# Patient Record
Sex: Female | Born: 1935 | Race: White | Hispanic: No | Marital: Married | Smoking: Former smoker
Health system: Southern US, Community
[De-identification: ages and names within clinical notes are randomized; demographics above are authoritative.]

## PROBLEM LIST (undated history)

## (undated) DIAGNOSIS — E039 Hypothyroidism, unspecified: Secondary | ICD-10-CM

## (undated) DIAGNOSIS — M1990 Unspecified osteoarthritis, unspecified site: Secondary | ICD-10-CM

## (undated) DIAGNOSIS — G8929 Other chronic pain: Secondary | ICD-10-CM

## (undated) DIAGNOSIS — F419 Anxiety disorder, unspecified: Secondary | ICD-10-CM

## (undated) DIAGNOSIS — R51 Headache: Secondary | ICD-10-CM

## (undated) DIAGNOSIS — I10 Essential (primary) hypertension: Secondary | ICD-10-CM

## (undated) DIAGNOSIS — M797 Fibromyalgia: Secondary | ICD-10-CM

## (undated) DIAGNOSIS — I00 Rheumatic fever without heart involvement: Secondary | ICD-10-CM

## (undated) DIAGNOSIS — K219 Gastro-esophageal reflux disease without esophagitis: Secondary | ICD-10-CM

## (undated) DIAGNOSIS — E785 Hyperlipidemia, unspecified: Secondary | ICD-10-CM

## (undated) DIAGNOSIS — I499 Cardiac arrhythmia, unspecified: Secondary | ICD-10-CM

## (undated) DIAGNOSIS — I4891 Unspecified atrial fibrillation: Secondary | ICD-10-CM

## (undated) HISTORY — PX: CHOLECYSTECTOMY: SHX55

## (undated) HISTORY — PX: APPENDECTOMY: SHX54

---

## 1998-09-25 ENCOUNTER — Encounter: Admission: RE | Admit: 1998-09-25 | Discharge: 1998-10-09 | Payer: Self-pay | Admitting: Orthopedic Surgery

## 2009-01-10 ENCOUNTER — Ambulatory Visit: Payer: Self-pay | Admitting: Cardiology

## 2009-03-29 HISTORY — PX: CARPAL TUNNEL RELEASE: SHX101

## 2009-10-21 ENCOUNTER — Ambulatory Visit (HOSPITAL_BASED_OUTPATIENT_CLINIC_OR_DEPARTMENT_OTHER): Admission: RE | Admit: 2009-10-21 | Discharge: 2009-10-21 | Payer: Self-pay | Admitting: Orthopedic Surgery

## 2009-12-09 ENCOUNTER — Ambulatory Visit (HOSPITAL_BASED_OUTPATIENT_CLINIC_OR_DEPARTMENT_OTHER): Admission: RE | Admit: 2009-12-09 | Discharge: 2009-12-09 | Payer: Self-pay | Admitting: Orthopedic Surgery

## 2010-05-18 NOTE — Op Note (Signed)
  NAMEGENESEE, Samantha               ACCOUNT NO.:  [id number]  MEDICAL RECORD NO.:  [id number]          PATIENT TYPE:  AMB  LOCATION:  DSC                          FACILITY:  [facility]  PHYSICIAN:  Cindee Salt, M.D.       DATE OF BIRTH:  1933/10/07  DATE OF PROCEDURE:  12/09/2009 DATE OF DISCHARGE:                              OPERATIVE REPORT  PREOPERATIVE DIAGNOSIS:  Carpal tunnel syndrome, left hand.  POSTOPERATIVE DIAGNOSIS:  Carpal tunnel syndrome, left hand.  OPERATION:  Decompression of left median nerve.  SURGEON:  Cindee Salt, MD  ASSISTANT:  None.  ANESTHESIA:  Forearm based IV regional.  ANESTHESIOLOGIST:  Janetta Hora. Gelene Mink, MD.  HISTORY:  The patient is a 77 year old female with a history of carpal tunnel syndrome.  EMG nerve conduction is positive, which has not responded to conservative treatment.  She has elected to undergo surgical decompression.  Pre, peri, and postoperative course have been discussed along with risks and complications.  She is aware that there is no guarantee with the surgery, possibility of infection, recurrence of injury to arteries, nerves, tendons, incomplete relief of symptoms, and dystrophy.  In the preoperative area, the patient is seen, the extremity marked by both the patient and surgeon, and antibiotic given.  PROCEDURE:  The patient was brought to the operating room where a forearm based IV regional anesthetic was carried out without difficulty under the direction of Dr. Gelene Mink.  She was prepped using DuraPrep, supine position, left arm free.  A 3-minute dry time was allowed, time- out was taken confirming patient and procedure.  A longitudinal incision was made in the palm and carried down through the subcutaneous tissue. Bleeders were electrocauterized.  Palmar fascia was split.  Superficial palmar arch was identified.  The flexor tendon of the ring and little finger identified to the ulnar side of the median nerve.  The  carpal retinaculum was incised with sharp dissection.  Right angle and Sewall retractor were placed between the skin and forearm fascia.  The fascia was released for approximately 1.5 cm proximal to the wrist crease under direct vision.  The canal was explored.  The area of compression to the nerve was apparent.  No further lesions were identified.  The wound was irrigated.  The skin was closed with interrupted 5-0 Vicryl Rapide sutures.  Local infiltration with 0.25% Marcaine without epinephrine was given, approximately 5 mL was used.  A sterile compressive dressing and splint to the wrist with the fingers free was applied.  On deflation of the tourniquet, all fingers were immediately pinked.  She was taken to the recovery room for observation in satisfactory condition.  She will be discharged to home to return to the [facility] [place] in 1 week, on Vicodin.          ______________________________ Cindee Salt, M.D.    GK/MEDQ  D:  12/09/2009  T:  12/10/2009  Job:  [id number]  Electronically Signed by Cindee Salt M.D. on 05/18/2010 12:07:16 PM

## 2010-06-11 LAB — POCT I-STAT, CHEM 8
BUN: 21 mg/dL (ref 6–23)
Creatinine, Ser: 1.2 mg/dL (ref 0.4–1.2)
Glucose, Bld: 159 mg/dL — ABNORMAL HIGH (ref 70–99)
Hemoglobin: 13.6 g/dL (ref 12.0–15.0)
TCO2: 26 mmol/L (ref 0–100)

## 2010-06-11 LAB — PROTIME-INR: Prothrombin Time: 19.8 seconds — ABNORMAL HIGH (ref 11.6–15.2)

## 2010-06-13 LAB — POCT I-STAT, CHEM 8
Chloride: 105 mEq/L (ref 96–112)
Creatinine, Ser: 1.4 mg/dL — ABNORMAL HIGH (ref 0.4–1.2)
Glucose, Bld: 164 mg/dL — ABNORMAL HIGH (ref 70–99)
HCT: 39 % (ref 36.0–46.0)
Hemoglobin: 13.3 g/dL (ref 12.0–15.0)
Potassium: 4.2 mEq/L (ref 3.5–5.1)
Sodium: 140 mEq/L (ref 135–145)

## 2010-06-13 LAB — GLUCOSE, CAPILLARY

## 2011-11-25 ENCOUNTER — Encounter (HOSPITAL_BASED_OUTPATIENT_CLINIC_OR_DEPARTMENT_OTHER): Payer: Self-pay | Admitting: *Deleted

## 2011-11-25 NOTE — Progress Notes (Signed)
Pt here for rt/lt ctr-2011-coumadin was stopped-was not told to stop it. Called and talked to dr Merlyn Lot nurse to ck Will see if dr Christell Constant can do labs and ekg-pt-ptt Will call [facility] for recent cardiology notes Will need istat

## 2011-11-26 NOTE — Progress Notes (Signed)
Called dr Buel Ream office-they will do PT PTT early Tuesday am and fax results-pt has call into her cardiologist in [facility] and dr Merlyn Lot about stopping coumadin. Will need [employer]

## 2011-12-01 ENCOUNTER — Encounter (HOSPITAL_BASED_OUTPATIENT_CLINIC_OR_DEPARTMENT_OTHER)
Admission: RE | Disposition: A | Discharge status: Home / Self Care | Payer: Self-pay | Source: Ambulatory Visit | Attending: Orthopedic Surgery

## 2011-12-01 ENCOUNTER — Encounter (HOSPITAL_BASED_OUTPATIENT_CLINIC_OR_DEPARTMENT_OTHER): Payer: Self-pay | Admitting: Anesthesiology

## 2011-12-01 ENCOUNTER — Encounter (HOSPITAL_BASED_OUTPATIENT_CLINIC_OR_DEPARTMENT_OTHER): Payer: Self-pay | Admitting: *Deleted

## 2011-12-01 ENCOUNTER — Encounter (HOSPITAL_BASED_OUTPATIENT_CLINIC_OR_DEPARTMENT_OTHER): Payer: Self-pay | Admitting: Orthopedic Surgery

## 2011-12-01 ENCOUNTER — Ambulatory Visit (HOSPITAL_BASED_OUTPATIENT_CLINIC_OR_DEPARTMENT_OTHER)
Admission: RE | Admit: 2011-12-01 | Discharge: 2011-12-01 | Disposition: A | Discharge status: Home / Self Care | Payer: Medicare Other | Source: Ambulatory Visit | Attending: Orthopedic Surgery | Admitting: Orthopedic Surgery

## 2011-12-01 ENCOUNTER — Ambulatory Visit (HOSPITAL_BASED_OUTPATIENT_CLINIC_OR_DEPARTMENT_OTHER): Payer: Medicare Other | Admitting: Anesthesiology

## 2011-12-01 DIAGNOSIS — F411 Generalized anxiety disorder: Secondary | ICD-10-CM | POA: Insufficient documentation

## 2011-12-01 DIAGNOSIS — E119 Type 2 diabetes mellitus without complications: Secondary | ICD-10-CM | POA: Insufficient documentation

## 2011-12-01 DIAGNOSIS — Z7901 Long term (current) use of anticoagulants: Secondary | ICD-10-CM | POA: Insufficient documentation

## 2011-12-01 DIAGNOSIS — E039 Hypothyroidism, unspecified: Secondary | ICD-10-CM | POA: Insufficient documentation

## 2011-12-01 DIAGNOSIS — IMO0001 Reserved for inherently not codable concepts without codable children: Secondary | ICD-10-CM | POA: Insufficient documentation

## 2011-12-01 DIAGNOSIS — I10 Essential (primary) hypertension: Secondary | ICD-10-CM | POA: Insufficient documentation

## 2011-12-01 DIAGNOSIS — G5620 Lesion of ulnar nerve, unspecified upper limb: Secondary | ICD-10-CM | POA: Insufficient documentation

## 2011-12-01 DIAGNOSIS — Z880 Allergy status to penicillin: Secondary | ICD-10-CM | POA: Insufficient documentation

## 2011-12-01 DIAGNOSIS — I4891 Unspecified atrial fibrillation: Secondary | ICD-10-CM | POA: Insufficient documentation

## 2011-12-01 DIAGNOSIS — M129 Arthropathy, unspecified: Secondary | ICD-10-CM | POA: Insufficient documentation

## 2011-12-01 DIAGNOSIS — E785 Hyperlipidemia, unspecified: Secondary | ICD-10-CM | POA: Insufficient documentation

## 2011-12-01 HISTORY — DX: Headache: R51

## 2011-12-01 HISTORY — DX: Fibromyalgia: M79.7

## 2011-12-01 HISTORY — DX: Rheumatic fever without heart involvement: I00

## 2011-12-01 HISTORY — PX: ULNAR NERVE TRANSPOSITION: SHX2595

## 2011-12-01 HISTORY — DX: Essential (primary) hypertension: I10

## 2011-12-01 HISTORY — DX: Cardiac arrhythmia, unspecified: I49.9

## 2011-12-01 HISTORY — DX: Gastro-esophageal reflux disease without esophagitis: K21.9

## 2011-12-01 HISTORY — DX: Other chronic pain: G89.29

## 2011-12-01 HISTORY — DX: Hyperlipidemia, unspecified: E78.5

## 2011-12-01 HISTORY — DX: Unspecified osteoarthritis, unspecified site: M19.90

## 2011-12-01 HISTORY — DX: Hypothyroidism, unspecified: E03.9

## 2011-12-01 HISTORY — DX: Anxiety disorder, unspecified: F41.9

## 2011-12-01 LAB — POCT I-STAT, CHEM 8
Calcium, Ion: 1.22 mmol/L (ref 1.13–1.30)
Chloride: 108 mEq/L (ref 96–112)
Glucose, Bld: 118 mg/dL — ABNORMAL HIGH (ref 70–99)
HCT: 42 % (ref 36.0–46.0)
TCO2: 23 mmol/L (ref 0–100)

## 2011-12-01 SURGERY — ULNAR NERVE DECOMPRESSION/TRANSPOSITION
Anesthesia: General | Site: Elbow | Laterality: Left | Wound class: Clean

## 2011-12-01 MED ORDER — LACTATED RINGERS IV SOLN
INTRAVENOUS | Status: DC
  Administered 2011-12-01: 11:00:00 via INTRAVENOUS

## 2011-12-01 MED ORDER — ONDANSETRON HCL 4 MG/2ML IJ SOLN
INTRAMUSCULAR | Status: DC | PRN
  Administered 2011-12-01: 4 mg via INTRAVENOUS

## 2011-12-01 MED ORDER — OXYCODONE HCL 5 MG/5ML PO SOLN: 5.0000 mg | Freq: Once | ORAL | Status: DC | PRN

## 2011-12-01 MED ORDER — METOCLOPRAMIDE HCL 5 MG/ML IJ SOLN
INTRAMUSCULAR | Status: DC | PRN
  Administered 2011-12-01: 10 mg via INTRAVENOUS

## 2011-12-01 MED ORDER — HYDROCODONE-ACETAMINOPHEN 5-500 MG PO TABS: 1.0000 | ORAL_TABLET | ORAL | Status: AC | PRN

## 2011-12-01 MED ORDER — DEXAMETHASONE SODIUM PHOSPHATE 4 MG/ML IJ SOLN
INTRAMUSCULAR | Status: DC | PRN
  Administered 2011-12-01: 10 mg via INTRAVENOUS

## 2011-12-01 MED ORDER — OXYCODONE HCL 5 MG PO TABS: 5.0000 mg | ORAL_TABLET | Freq: Once | ORAL | Status: DC | PRN

## 2011-12-01 MED ORDER — PROPOFOL 10 MG/ML IV BOLUS
INTRAVENOUS | Status: DC | PRN
  Administered 2011-12-01: 200 mg via INTRAVENOUS

## 2011-12-01 MED ORDER — LIDOCAINE HCL (CARDIAC) 20 MG/ML IV SOLN
INTRAVENOUS | Status: DC | PRN
  Administered 2011-12-01: 40 mg via INTRAVENOUS

## 2011-12-01 MED ORDER — FENTANYL CITRATE 0.05 MG/ML IJ SOLN: 25.0000 ug | INTRAMUSCULAR | Status: DC | PRN

## 2011-12-01 MED ORDER — BUPIVACAINE HCL (PF) 0.25 % IJ SOLN
INTRAMUSCULAR | Status: DC | PRN
  Administered 2011-12-01: 8 mL

## 2011-12-01 MED ORDER — FENTANYL CITRATE 0.05 MG/ML IJ SOLN
INTRAMUSCULAR | Status: DC | PRN
  Administered 2011-12-01: 50 ug via INTRAVENOUS

## 2011-12-01 MED ORDER — METOCLOPRAMIDE HCL 5 MG/ML IJ SOLN: 10.0000 mg | Freq: Once | INTRAMUSCULAR | Status: DC | PRN

## 2011-12-01 MED ORDER — CHLORHEXIDINE GLUCONATE 4 % EX LIQD: 60.0000 mL | Freq: Once | CUTANEOUS | Status: DC

## 2011-12-01 SURGICAL SUPPLY — 50 items
BANDAGE GAUZE ELAST BULKY 4 IN (GAUZE/BANDAGES/DRESSINGS) ×2 IMPLANT
BLADE MINI RND TIP GREEN BEAV (BLADE) IMPLANT
BLADE SURG 15 STRL LF DISP TIS (BLADE) ×1 IMPLANT
BLADE SURG 15 STRL SS (BLADE) ×1
BNDG COHESIVE 3X5 TAN STRL LF (GAUZE/BANDAGES/DRESSINGS) ×4 IMPLANT
BNDG ESMARK 4X9 LF (GAUZE/BANDAGES/DRESSINGS) ×2 IMPLANT
CHLORAPREP W/TINT 26ML (MISCELLANEOUS) ×2 IMPLANT
CLOTH BEACON ORANGE TIMEOUT ST (SAFETY) ×2 IMPLANT
CORDS BIPOLAR (ELECTRODE) ×2 IMPLANT
COVER MAYO STAND STRL (DRAPES) ×2 IMPLANT
COVER TABLE BACK 60X90 (DRAPES) ×2 IMPLANT
CUFF TOURNIQUET SINGLE 18IN (TOURNIQUET CUFF) ×2 IMPLANT
DECANTER SPIKE VIAL GLASS SM (MISCELLANEOUS) IMPLANT
DRAPE EXTREMITY T 121X128X90 (DRAPE) ×2 IMPLANT
DRAPE SURG 17X23 STRL (DRAPES) ×2 IMPLANT
DRSG KUZMA FLUFF (GAUZE/BANDAGES/DRESSINGS) IMPLANT
DRSG PAD ABDOMINAL 8X10 ST (GAUZE/BANDAGES/DRESSINGS) ×2 IMPLANT
GAUZE SPONGE 4X4 16PLY XRAY LF (GAUZE/BANDAGES/DRESSINGS) IMPLANT
GAUZE XEROFORM 1X8 LF (GAUZE/BANDAGES/DRESSINGS) ×2 IMPLANT
GLOVE BIO SURGEON STRL SZ 6.5 (GLOVE) ×2 IMPLANT
GLOVE ECLIPSE 6.5 STRL STRAW (GLOVE) ×2 IMPLANT
GLOVE SURG ORTHO 8.0 STRL STRW (GLOVE) ×2 IMPLANT
GOWN BRE IMP PREV XXLGXLNG (GOWN DISPOSABLE) ×2 IMPLANT
GOWN PREVENTION PLUS XLARGE (GOWN DISPOSABLE) ×4 IMPLANT
LOOP VESSEL MAXI BLUE (MISCELLANEOUS) IMPLANT
NDL SUT 6 .5 CRC .975X.05 MAYO (NEEDLE) IMPLANT
NEEDLE 27GAX1X1/2 (NEEDLE) ×2 IMPLANT
NEEDLE MAYO TAPER (NEEDLE)
NS IRRIG 1000ML POUR BTL (IV SOLUTION) ×2 IMPLANT
PACK BASIN DAY SURGERY FS (CUSTOM PROCEDURE TRAY) ×2 IMPLANT
PAD CAST 3X4 CTTN HI CHSV (CAST SUPPLIES) ×1 IMPLANT
PAD CAST 4YDX4 CTTN HI CHSV (CAST SUPPLIES) ×1 IMPLANT
PADDING CAST ABS 4INX4YD NS (CAST SUPPLIES)
PADDING CAST ABS COTTON 4X4 ST (CAST SUPPLIES) IMPLANT
PADDING CAST COTTON 3X4 STRL (CAST SUPPLIES) ×1
PADDING CAST COTTON 4X4 STRL (CAST SUPPLIES) ×1
SLEEVE SCD COMPRESS KNEE MED (MISCELLANEOUS) ×2 IMPLANT
SPLINT PLASTER CAST XFAST 3X15 (CAST SUPPLIES) ×30 IMPLANT
SPLINT PLASTER XTRA FASTSET 3X (CAST SUPPLIES) ×30
SPONGE GAUZE 4X4 12PLY (GAUZE/BANDAGES/DRESSINGS) ×2 IMPLANT
STOCKINETTE 4X48 STRL (DRAPES) ×2 IMPLANT
SUT VIC AB 2-0 SH 27 (SUTURE) ×1
SUT VIC AB 2-0 SH 27XBRD (SUTURE) ×1 IMPLANT
SUT VICRYL 4-0 PS2 18IN ABS (SUTURE) IMPLANT
SUT VICRYL RAPIDE 4/0 PS 2 (SUTURE) ×2 IMPLANT
SYR BULB 3OZ (MISCELLANEOUS) ×2 IMPLANT
SYR CONTROL 10ML LL (SYRINGE) ×2 IMPLANT
TOWEL OR 17X24 6PK STRL BLUE (TOWEL DISPOSABLE) ×4 IMPLANT
UNDERPAD 30X30 INCONTINENT (UNDERPADS AND DIAPERS) ×2 IMPLANT
WATER STERILE IRR 1000ML POUR (IV SOLUTION) IMPLANT

## 2011-12-01 NOTE — Op Note (Signed)
Dictated number: [id number]

## 2011-12-01 NOTE — Anesthesia Preprocedure Evaluation (Signed)
Anesthesia Evaluation  Patient identified by MRN, date of birth, ID band Patient awake    Reviewed: Allergy & Precautions, H&P , NPO status , Patient's Chart, lab work & pertinent test results, reviewed documented beta blocker date and time   Airway Mallampati: II TM Distance: >3 FB Neck ROM: full    Dental   Pulmonary neg pulmonary ROS,  breath sounds clear to auscultation        Cardiovascular hypertension, On Medications and On Home Beta Blockers negative cardio ROS  + dysrhythmias Atrial Fibrillation Rhythm:regular     Neuro/Psych  Headaches, PSYCHIATRIC DISORDERS  Neuromuscular disease    GI/Hepatic Neg liver ROS, GERD-  Medicated and Controlled,  Endo/Other  negative endocrine ROSdiabetes  Renal/GU negative Renal ROS  negative genitourinary   Musculoskeletal   Abdominal   Peds  Hematology negative hematology ROS (+)   Anesthesia Other Findings See surgeon's H&P   Reproductive/Obstetrics negative OB ROS                           Anesthesia Physical Anesthesia Plan  ASA: III  Anesthesia Plan: General   Post-op Pain Management:    Induction: Intravenous  Airway Management Planned: LMA  Additional Equipment:   Intra-op Plan:   Post-operative Plan: Extubation in OR  Informed Consent: I have reviewed the patients History and Physical, chart, labs and discussed the procedure including the risks, benefits and alternatives for the proposed anesthesia with the patient or authorized representative who has indicated his/her understanding and acceptance.   Dental Advisory Given  Plan Discussed with: CRNA and Surgeon  Anesthesia Plan Comments:         Anesthesia Quick Evaluation

## 2011-12-01 NOTE — Anesthesia Procedure Notes (Signed)
Procedure Name: LMA Insertion Performed by: Carol Theys W Pre-anesthesia Checklist: Patient identified, Timeout performed, Emergency Drugs available, Suction available and Patient being monitored Patient Re-evaluated:Patient Re-evaluated prior to inductionOxygen Delivery Method: Circle system utilized Preoxygenation: Pre-oxygenation with 100% oxygen Intubation Type: IV induction Ventilation: Mask ventilation without difficulty LMA: LMA with gastric port inserted LMA Size: 4.0 Number of attempts: 1 Placement Confirmation: breath sounds checked- equal and bilateral and positive ETCO2 Tube secured with: Tape Dental Injury: Teeth and Oropharynx as per pre-operative assessment      

## 2011-12-01 NOTE — Brief Op Note (Signed)
12/01/2011  12:29 PM  PATIENT:  Samantha Compton  78 y.o. female  PRE-OPERATIVE DIAGNOSIS:  left cubital tunnel   POST-OPERATIVE DIAGNOSIS:  left cubital tunnel   PROCEDURE:  Procedure(s) (LRB) with comments: ULNAR NERVE DECOMPRESSION/TRANSPOSITION (Left) - decompression possible transposition left ulnar nerve  SURGEON:  Surgeon(s) and Role:    * Nicki Reaper, MD - Primary  PHYSICIAN ASSISTANT:   ASSISTANTS: none   ANESTHESIA:   local and general  EBL:  Total I/O In: 800 [I.V.:800] Out: -   BLOOD ADMINISTERED:none  DRAINS: none   LOCAL MEDICATIONS USED:  MARCAINE     SPECIMEN:  No Specimen  DISPOSITION OF SPECIMEN:  N/A  COUNTS:  YES  TOURNIQUET:   Total Tourniquet Time Documented: Upper Arm (Left) - 21 minutes  DICTATION: .Other Dictation: Dictation Number [id number]  PLAN OF CARE: Discharge to home after PACU  PATIENT DISPOSITION:  PACU - hemodynamically stable.

## 2011-12-01 NOTE — Transfer of Care (Signed)
Immediate Anesthesia Transfer of Care Note  Patient: Samantha Compton  Procedure(s) Performed: Procedure(s) (LRB) with comments: ULNAR NERVE DECOMPRESSION/TRANSPOSITION (Left) - decompression possible transposition left ulnar nerve  Patient Location: PACU  Anesthesia Type: General  Level of Consciousness: sedated and patient cooperative  Airway & Oxygen Therapy: Patient Spontanous Breathing and Patient connected to face mask oxygen  Post-op Assessment: Report given to PACU RN and Post -op Vital signs reviewed and stable  Post vital signs: Reviewed and stable  Complications: No apparent anesthesia complications

## 2011-12-01 NOTE — H&P (Signed)
Samantha Compton is 23 and is complaining of numbness and tingling in her little fingers bilaterally, left greater than right. This has been going on since her last visit in 2011. She has tried wearing Pil-O splints at night and taking care to protect her elbows. This has not been entirely successful. She has no new injuries. She is diabetic. 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  These reveal a conduction deficit, decreased amplitude on her left side with a conduction velocity of 36 on her left and 48 on her right.    Past Medical History: She is allergic to PCN. She is on the following medications: Tambocor, Coumadin, Lisinopril, Norvasc, Bystolic, Levothyroxine, Allopurinol, potassium, Furosemide, Glimepiride, Lipitor, and Januvia. She has had bilateral carpal tunnel release and cholecystectomy.  Family Medical History: Positive for diabetes, heart disease, high BP and arthritis.    Social History: She does not smoke or drink. She is married.  Review of Systems: Positive for glasses, ringing in her ears, high BP, otherwise negative. Samantha Compton is an 78 y.o. female.   Chief Complaint: Ulnar neuropathy left elbow HPI: see above  Past Medical History  Diagnosis Date  . Dysrhythmia     AF  . Hypertension   . Diabetes mellitus   . Hyperlipemia   . Fibromyalgia   . Arthritis   . Hypothyroidism   . Chronic pain   . Rheumatic fever     hx  . Anxiety   . GERD (gastroesophageal reflux disease)     no meds now  . Headache     Past Surgical History  Procedure Date  . Cholecystectomy   . Appendectomy   . Carpal tunnel release 2011    right and left 2011    History reviewed. No pertinent family history. Social History:  reports that she has never smoked. She does not have any smokeless tobacco history on file. She reports that she does not drink alcohol or use illicit drugs.  Allergies:    Allergies  Allergen Reactions  . Penicillins Rash    Medications Prior to Admission  Medication Sig Dispense Refill  . acetaminophen (TYLENOL) 500 MG tablet Take 500 mg by mouth every 6 (six) hours as needed.      Marland Kitchen allopurinol (ZYLOPRIM) 300 MG tablet Take 300 mg by mouth daily.      . cholecalciferol (VITAMIN D) 1000 UNITS tablet Take 1,000 Units by mouth daily.      . flecainide (TAMBOCOR) 100 MG tablet Take 100 mg by mouth 2 (two) times daily.      Marland Kitchen glimepiride (AMARYL) 4 MG tablet Take 4 mg by mouth daily before breakfast.      . hydrochlorothiazide (HYDRODIURIL) 25 MG tablet Take 25 mg by mouth daily.      Marland Kitchen levothyroxine (SYNTHROID, LEVOTHROID) 50 MCG tablet Take 50 mcg by mouth daily.      Marland Kitchen linagliptin (TRADJENTA) 5 MG TABS tablet Take 5 mg by mouth daily.      . methocarbamol (ROBAXIN) 750 MG tablet Take 750 mg by mouth as needed.      . nebivolol (BYSTOLIC) 10 MG tablet Take 10 mg by mouth daily.      . potassium chloride SA (K-DUR,KLOR-CON) 20 MEQ tablet Take 20 mEq by mouth daily.      Marland Kitchen pyridOXINE (VITAMIN B-6) 25 MG tablet Take 25 mg by mouth daily.      . ramipril (ALTACE) 10  MG capsule Take 10 mg by mouth daily.      Marland Kitchen warfarin (COUMADIN) 4 MG tablet Take 4 mg by mouth daily. Takes 4mg  3 days-1/2 the other 4        Results for orders placed during the hospital encounter of 12/01/11 (from the past 48 hour(s))  POCT I-STAT, CHEM 8     Status: Abnormal   Collection Time   12/01/11 10:35 AM      Component Value Range Comment   Sodium 144  135 - 145 mEq/L    Potassium 4.1  3.5 - 5.1 mEq/L    Chloride 108  96 - 112 mEq/L    BUN 19  6 - 23 mg/dL    Creatinine, Ser 1.61  0.50 - 1.10 mg/dL    Glucose, Bld 096 (*) 70 - 99 mg/dL    Calcium, Ion 0.45  4.09 - 1.30 mmol/L    TCO2 23  0 - 100 mmol/L    Hemoglobin 14.3  12.0 - 15.0 g/dL    HCT 81.1  91.4 - 78.2 %     No results found.   Pertinent items are noted in HPI.  Blood pressure 189/92, pulse 53, temperature 98  F (36.7 C), temperature source Oral, resp. rate 20, height 5\' 5"  (1.651 m), weight 180 lb (81.647 kg), SpO2 95.00%.  General appearance: alert, cooperative and appears stated age Head: Normocephalic, without obvious abnormality Neck: no adenopathy Resp: clear to auscultation bilaterally Cardio: regular rate and rhythm, S1, S2 normal, no murmur, click, rub or gallop GI: soft, non-tender; bowel sounds normal; no masses,  no organomegaly Extremities: extremities normal, atraumatic, no cyanosis or edema Pulses: 2+ and symmetric Skin: Skin color, texture, turgor normal. No rashes or lesions Neurologic: Grossly normal Incision/Wound: na  Assessment/Plan   We have discussed the possibility of surgical decompression, possible transposition to the ulnar nerve left side.  The pre, peri and postoperative course were discussed along with the risks and complications.  The patient is aware there is no guarantee with the surgery, possibility of infection, recurrence, injury to arteries, nerves, and tendons.  She is desirous of proceeding. She is scheduled for decompression, possible transposition left  ulnar nerve as an outpatient.   Sedric Guia R 12/01/2011, 11:16 AM

## 2011-12-01 NOTE — Anesthesia Postprocedure Evaluation (Signed)
Anesthesia Post Note  Patient: Samantha Compton  Procedure(s) Performed: Procedure(s) (LRB): ULNAR NERVE DECOMPRESSION/TRANSPOSITION (Left)  Anesthesia type: General  Patient location: PACU  Post pain: Pain level controlled  Post assessment: Patient's Cardiovascular Status Stable  Last Vitals:  Filed Vitals:   12/01/11 1319  BP: 195/65  Pulse: 62  Temp: 36.4 C  Resp: 16    Post vital signs: Reviewed and stable  Level of consciousness: alert  Complications: No apparent anesthesia complications

## 2011-12-02 ENCOUNTER — Encounter (HOSPITAL_BASED_OUTPATIENT_CLINIC_OR_DEPARTMENT_OTHER): Payer: Self-pay | Admitting: Orthopedic Surgery

## 2011-12-02 NOTE — Op Note (Signed)
Samantha Compton, Samantha Compton               ACCOUNT NO.:  [id number]  MEDICAL RECORD NO.:  [id number]  LOCATION:                                 FACILITY:  PHYSICIAN:  Cindee Salt, M.D.       DATE OF BIRTH:  Nov 15, 1933  DATE OF PROCEDURE:  12/01/2011 DATE OF DISCHARGE:                              OPERATIVE REPORT   PREOPERATIVE DIAGNOSIS:  Cubital tunnel syndrome, left arm.  POSTOPERATIVE DIAGNOSIS:  Cubital tunnel syndrome, left arm.  OPERATION:  Decompression left ulnar nerve at the left elbow.  SURGEON:  Cindee Salt, M.D.  ASSISTANT:  None.  ANESTHESIA:  General with local infiltration.  ANESTHESIOLOGIST:  Janetta Hora. Gelene Mink, M.D.  HISTORY:  The patient is a 78 year old female with a history of numbness and tingling of her hands specifically ring and little finger.  Nerve conductions are positive for cubital tunnel syndrome bilaterally.  She has elected to proceed to have it surgically released.  Pre, peri, and postoperative course have been discussed along with risks and complications.  She is aware that there is no guarantee with the surgery, possibility of infection, recurrence, injury to arteries, nerves, tendons, incomplete relief of symptoms, dystrophy.  In the preoperative area, the patient is seen, the extremity marked by both the patient and surgeon.  Antibiotic given.  PROCEDURE:  The patient was brought to the operating room where a general anesthetic was carried out without difficulty.  She was prepped using ChloraPrep, supine position, left arm free.  A 3-minute dry time was allowed.  Time-out taken confirming the patient and procedure.  The limb was exsanguinated with an Esmarch bandage.  Tourniquet placed high on the arm was inflated to 250 mmHg.  An incision was made over the medial epicondyle left arm, carried down through subcutaneous tissue. Bleeders were electrocauterized with bipolar.  The dissection carried down to [facility] fascia.  This was released.  The  ulnar nerve was immediately identified, this was found to be compressed distally to a marked degree of the flexor carpi ulnaris sheath.  With blunt sharp dissection, the subcutaneous tissue was dissected from the proximal fascia.  Care was taken to protect neurovascular structures and a release performed after placement of 2 knee retractors and a KMI carpal tunnel release.  Guide was placed between the nerve.  The fascia was released for approximately 6 cm proximal to the elbow under direct vision using angled ENT scissors.  Direction was then taken distally, Osborne fascia was released, the subcutaneous tissue dissected from the flexor carpi ulnaris fascia, and a fasciotomy of the 2 muscle bellies of the flexor carpi ulnaris was then performed.  This allowed splitting the muscle identifying the deep fascia and the ulnar nerve.  Again the [facility] skid was placed between the ulnar nerve and the deep fascia of flexor carpi ulnaris, and a release performed distally.  Very significant compression of the nerve was present at the entrance of the flexor carpi ulnaris.  This was released over approximately 6 cm distally.  Palpation revealed that the nerve was adequately reduced proximally and distally with flexion of the elbow.  There was no dislocation, subluxation to the nerve.  The wound was  copiously irrigated with saline.  The subcutaneous tissue was closed with interrupted 3-0 Vicryl sutures and the skin with interrupted 4- 0 Vicryl Rapide sutures.  A sterile compressive dressing, long-arm splint with the elbow flexed approximately 30 degrees was applied.  On deflation of the tourniquet, all fingers immediately pinked.  She was taken to the recovery room for observation.          ______________________________ Cindee Salt, M.D.     GK/MEDQ  D:  12/01/2011  T:  12/02/2011  Job:  [id number]

## 2012-06-22 ENCOUNTER — Ambulatory Visit (INDEPENDENT_AMBULATORY_CARE_PROVIDER_SITE_OTHER): Payer: Medicare Other | Admitting: Pharmacist

## 2012-06-22 DIAGNOSIS — I4891 Unspecified atrial fibrillation: Secondary | ICD-10-CM | POA: Insufficient documentation

## 2012-06-22 LAB — POCT INR: INR: 2.3

## 2012-07-06 ENCOUNTER — Ambulatory Visit (INDEPENDENT_AMBULATORY_CARE_PROVIDER_SITE_OTHER): Payer: Medicare Other | Admitting: Family Medicine

## 2012-07-06 ENCOUNTER — Encounter: Payer: Self-pay | Admitting: Family Medicine

## 2012-07-06 VITALS — BP 161/80 | HR 60 | Temp 97.5°F | Ht 65.0 in | Wt 180.0 lb

## 2012-07-06 DIAGNOSIS — IMO0001 Reserved for inherently not codable concepts without codable children: Secondary | ICD-10-CM

## 2012-07-06 DIAGNOSIS — I4891 Unspecified atrial fibrillation: Secondary | ICD-10-CM

## 2012-07-06 DIAGNOSIS — E039 Hypothyroidism, unspecified: Secondary | ICD-10-CM

## 2012-07-06 DIAGNOSIS — I10 Essential (primary) hypertension: Secondary | ICD-10-CM | POA: Insufficient documentation

## 2012-07-06 DIAGNOSIS — E785 Hyperlipidemia, unspecified: Secondary | ICD-10-CM

## 2012-07-06 DIAGNOSIS — E119 Type 2 diabetes mellitus without complications: Secondary | ICD-10-CM

## 2012-07-06 DIAGNOSIS — M791 Myalgia, unspecified site: Secondary | ICD-10-CM

## 2012-07-06 DIAGNOSIS — M109 Gout, unspecified: Secondary | ICD-10-CM

## 2012-07-06 LAB — HEPATIC FUNCTION PANEL
ALT: 19 U/L (ref 0–35)
AST: 18 U/L (ref 0–37)
Albumin: 4.2 g/dL (ref 3.5–5.2)
Alkaline Phosphatase: 86 U/L (ref 39–117)
Bilirubin, Direct: 0.1 mg/dL (ref 0.0–0.3)
Indirect Bilirubin: 0.5 mg/dL (ref 0.0–0.9)
Total Bilirubin: 0.6 mg/dL (ref 0.3–1.2)
Total Protein: 7.8 g/dL (ref 6.0–8.3)

## 2012-07-06 LAB — POCT GLYCOSYLATED HEMOGLOBIN (HGB A1C): Hemoglobin A1C: 6.9

## 2012-07-06 LAB — BASIC METABOLIC PANEL WITH GFR
BUN: 19 mg/dL (ref 6–23)
CO2: 30 mEq/L (ref 19–32)
Calcium: 9.7 mg/dL (ref 8.4–10.5)
Chloride: 103 mEq/L (ref 96–112)
Creat: 0.95 mg/dL (ref 0.50–1.10)
GFR, Est African American: 67 mL/min
GFR, Est Non African American: 58 mL/min — ABNORMAL LOW
Glucose, Bld: 94 mg/dL (ref 70–99)
Potassium: 4.4 mEq/L (ref 3.5–5.3)
Sodium: 143 mEq/L (ref 135–145)

## 2012-07-06 LAB — TSH: TSH: 3.516 u[IU]/mL (ref 0.350–4.500)

## 2012-07-06 LAB — URIC ACID: Uric Acid, Serum: 6.8 mg/dL — ABNORMAL HIGH (ref 2.4–6.0)

## 2012-07-06 MED ORDER — RAMIPRIL 10 MG PO CAPS: 20.0000 mg | ORAL_CAPSULE | Freq: Every day | ORAL | Status: DC

## 2012-07-06 NOTE — Patient Instructions (Addendum)

## 2012-07-06 NOTE — Assessment & Plan Note (Signed)
vitamin D ordered today

## 2012-07-06 NOTE — Assessment & Plan Note (Signed)
Stable

## 2012-07-06 NOTE — Progress Notes (Signed)
Patient ID: Samantha Compton, female   DOB: [birth year], 79 y.o.   MRN: [id number] SUBJECTIVE:   HPI:  Patient here for followup for her multiple medical problems. She has been doing fairly well. She is stable. She is here for lab work. No new symptoms. Her major problem is financial, and the cost of the bystolic.  PMH/PSH: reviewed/updated in Epic  SH/FH: reviewed/updated in Epic  Allergies: reviewed/updated in Epic  Medications: reviewed/updated in Epic  Immunizations: reviewed/updated in Epic   ROS: As above in the HPI. All other systems are stable or negative.  OBJECTIVE:     On examination she appeared in good health and spirits.overweight Vital signs as documented. BP 161/80  Pulse 60  Temp(Src) 97.5 F (36.4 C) (Oral)  Ht 5\' 5"  (1.651 m)  Wt 180 lb (81.647 kg)  BMI 29.95 kg/m2  Skin warm and dry and without overt rashes.  Head, Eyes, Ears, throat: normal Neck without JVD.  Lungs clear.  Heart exam notable for regular rhythm, normal sounds and absence of murmurs, rubs or gallops. Abdomen unremarkable and without evidence of organomegaly, masses, or abdominal aortic enlargement.  GYN Exam:n/a Extremities trace edematous. Neurologic: nonfocal  ASSESSMENT:  DM (diabetes mellitus) hgba1c ordered today. No real changes.  HLD (hyperlipidemia) No symptoms. Stable. Labs drawn. Await result  HTN (hypertension) Blood pressure elevated. Chest pain. No shortness of breath. No cough . Taking only one a day instead of 2. It's expensive. No headache. No neural deficit. We'll increase the Altace 2 tabs per day.  Gout Was told she had she may have fibromyalgia. Vitamin D was never checked. Ordered that today Uric acid  checked today.  Myalgia  vitamin D ordered today  Afib Stable     PLAN:  Orders Placed This Encounter  Procedures  . Hepatic function panel  . BASIC METABOLIC PANEL WITH GFR  . NMR Lipoprofile with Lipids  . TSH  . Uric acid  . Vitamin D 25  hydroxy  . POCT glycosylated hemoglobin (Hb A1C)  . POCT UA - Microalbumin   No results found for this or any previous visit (from the past 24 hour(s)). Meds ordered this encounter  Medications  . febuxostat (ULORIC) 40 MG tablet    Sig: Take 40 mg by mouth daily.  . ramipril (ALTACE) 10 MG capsule    Sig: Take 2 capsules (20 mg total) by mouth daily.    Dispense:  60 capsule    Refill:  3   we increased her Altace to 2 tablets a day. This will bring her blood pressure under control and obtain goal. We'll await her lab results. To see if we have attain goal. Adjustments will be made as necessary. Information-diet given:-DASH Diet  Return to clinic in 2 months.  Ysela Hettinger P. Modesto Charon, M.D.

## 2012-07-06 NOTE — Addendum Note (Signed)
Addended by: Prescott Gum on: 07/06/2012 02:29 PM   Modules accepted: Orders

## 2012-07-06 NOTE — Assessment & Plan Note (Addendum)
Was told she had she may have fibromyalgia. Vitamin D was never checked. Ordered that today Uric acid  checked today.

## 2012-07-06 NOTE — Assessment & Plan Note (Signed)
Blood pressure elevated. Chest pain. No shortness of breath. No cough . Taking only one a day instead of 2. It's expensive. No headache. No neural deficit. We'll increase the Altace 2 tabs per day.

## 2012-07-06 NOTE — Assessment & Plan Note (Signed)
hgba1c ordered today. No real changes.

## 2012-07-06 NOTE — Assessment & Plan Note (Signed)
No symptoms. Stable. Labs drawn. Await result

## 2012-07-07 LAB — NMR LIPOPROFILE WITH LIPIDS
Cholesterol, Total: 259 mg/dL — ABNORMAL HIGH (ref <200)
HDL Particle Number: 22.6 umol/L — ABNORMAL LOW (ref >=30.5)
HDL Size: 8.9 nm — ABNORMAL LOW (ref >=9.2)
HDL-C: 43 mg/dL (ref >=40)
LDL (calc): 183 mg/dL — ABNORMAL HIGH (ref <100)
LDL Particle Number: 2216 nmol/L — ABNORMAL HIGH (ref <1000)
LDL Size: 20.3 nm — ABNORMAL LOW (ref >20.5)
LP-IR Score: 53 — ABNORMAL HIGH (ref <=45)
Large HDL-P: 3.6 umol/L — ABNORMAL LOW (ref >=4.8)
Large VLDL-P: 1.8 nmol/L (ref <=2.7)
Small LDL Particle Number: 1268 nmol/L — ABNORMAL HIGH (ref <=527)
Triglycerides: 167 mg/dL — ABNORMAL HIGH (ref <150)
VLDL Size: 45.2 nm (ref <=46.6)

## 2012-07-07 LAB — VITAMIN D 25 HYDROXY (VIT D DEFICIENCY, FRACTURES): Vit D, 25-Hydroxy: 41 ng/mL (ref 30–89)

## 2012-07-07 NOTE — Progress Notes (Signed)
Quick Note:  Labs abnormal.Not at [facility]! Needs to see Pharmacist to review and recommend meds and adjustments. For DM and hyperlipidemia. ______

## 2012-08-01 ENCOUNTER — Ambulatory Visit (INDEPENDENT_AMBULATORY_CARE_PROVIDER_SITE_OTHER): Payer: Medicare Other | Admitting: Pharmacist Clinician (PhC)/ Clinical Pharmacy Specialist

## 2012-08-01 DIAGNOSIS — E119 Type 2 diabetes mellitus without complications: Secondary | ICD-10-CM

## 2012-08-01 DIAGNOSIS — I4891 Unspecified atrial fibrillation: Secondary | ICD-10-CM

## 2012-08-01 DIAGNOSIS — I10 Essential (primary) hypertension: Secondary | ICD-10-CM

## 2012-08-01 MED ORDER — LISINOPRIL 40 MG PO TABS: 40.0000 mg | ORAL_TABLET | Freq: Every day | ORAL | Status: DC

## 2012-08-01 MED ORDER — METFORMIN HCL 1000 MG PO TABS: 1000.0000 mg | ORAL_TABLET | Freq: Two times a day (BID) | ORAL | Status: DC

## 2012-08-01 NOTE — Progress Notes (Signed)
  Subjective:    Patient ID: Samantha Compton, female    DOB: 1933/05/15, 79 y.o.   MRN: [id number]  HPI  Long standing history of type 2 DM treated with oral medications.  Past treatment with metformin but stopped two years ago, reason unknown.  She has normal serum creatinine levels of 0.95 and a GFR of 58 ml/min.    Review of Systems  Constitutional: Negative.   HENT: Negative.   Eyes: Negative.   Respiratory: Negative.   Cardiovascular: Negative.   Endocrine: Negative.   Genitourinary: Negative.   Allergic/Immunologic: Negative.   Neurological: Negative.   Hematological: Negative.   Psychiatric/Behavioral: Negative.        Objective:   Physical Exam  Constitutional: She appears well-developed and well-nourished.  Skin: Skin is warm and dry.  Psychiatric: She has a normal mood and affect. Her behavior is normal. Judgment and thought content normal.          Assessment & Plan:   Diabetes Follow-Up Visit Chief Complaint:  No chief complaint on file.    Exam Regularity:  IR  Edema:  neg  Polyuria:  neg  Polydipsia:  neg Polyphagia:  neg  BMI:  There is no weight on file to calculate BMI.   Weight changes:  stable General Appearance:  alert, oriented, no acute distress Mood/Affect:  normal  HPI:  Type 2 Diabetes  Low fat/carbohydrate diet?  Yes Nicotine Abuse?  No Medication Compliance?  Yes Exercise?  No Alcohol Abuse?  No  Home BG Monitoring:  Checking 2 times a day. Average:  145  High: 177  Low:  95   Lab Results  Component Value Date   HGBA1C 6.9 07/06/2012    No results found for this basename: Concepcion Elk    Lab Results  Component Value Date   LDLCALC 183* 07/06/2012   TRIG 167* 07/06/2012      Assessment: 1.  Diabetes.  Excellent control, patient concerned with taking tradjenta due to tv ads 2.  Blood Pressure.  Systolic hypertension  152/80 3.  Lipids.  Elevated LDL, statin intolerant 4.  Foot Care.  daily 5.  Dental Care.  Twice a  year 6.  Eye Care/Exam.  annual  Recommendations: 1.  Patient is counseled on appropriate foot care. 2.  BP goal < 130/80. 3.  LDL goal of < 100, HDL > 40 and TG < 150. 4.  Eye Exam yearly and Dental Exam every 6 months. 5.  Dietary recommendations:  1500 calorie ADA diet 6.  Physical Activity recommendations:  15 minutes a day as tolerated 7.  Medication recommendations at this time are as follows:  Stop tradjenta start metformin 1 gm bid 8.  Return to clinic in 4-6 wks   Time spent counseling patient:  45 min  Physician time spent with patient:    Referring provider:  Modesto Charon   PharmD:  Chari Manning, [facility]

## 2012-08-10 ENCOUNTER — Ambulatory Visit (INDEPENDENT_AMBULATORY_CARE_PROVIDER_SITE_OTHER): Payer: Medicare Other | Admitting: Family Medicine

## 2012-08-10 ENCOUNTER — Encounter: Payer: Self-pay | Admitting: Family Medicine

## 2012-08-10 VITALS — BP 160/85 | HR 68 | Temp 97.4°F | Ht 64.0 in | Wt 177.4 lb

## 2012-08-10 DIAGNOSIS — E785 Hyperlipidemia, unspecified: Secondary | ICD-10-CM

## 2012-08-10 DIAGNOSIS — I4891 Unspecified atrial fibrillation: Secondary | ICD-10-CM

## 2012-08-10 DIAGNOSIS — E119 Type 2 diabetes mellitus without complications: Secondary | ICD-10-CM

## 2012-08-10 DIAGNOSIS — I10 Essential (primary) hypertension: Secondary | ICD-10-CM

## 2012-08-10 MED ORDER — METFORMIN HCL 500 MG PO TABS: 500.0000 mg | ORAL_TABLET | Freq: Two times a day (BID) | ORAL | Status: DC

## 2012-08-10 MED ORDER — NEBIVOLOL HCL 20 MG PO TABS: 20.0000 mg | ORAL_TABLET | Freq: Every day | ORAL | Status: DC

## 2012-08-10 NOTE — Progress Notes (Signed)
Patient ID: Samantha Compton, female   DOB: 12/22/1933, 79 y.o.   MRN: [id number] SUBJECTIVE: HPI: Patient is here for follow up of hypertension/DM/Gout/HLD:  denies Headache;deniesChest Pain;denies weakness;denies Shortness of Breath or Orthopnea;denies Visual changes;denies palpitations;denies cough;denies pedal edema;denies symptoms of TIA or stroke; admits to Compliance with medications.  Problems with medications.diarrhea with the metformin.   PMH/PSH: reviewed/updated in Epic  SH/FH: reviewed/updated in Epic  Allergies: reviewed/updated in Epic  Medications: reviewed/updated in Epic  Immunizations: reviewed/updated in Epic  ROS: As above in the HPI. All other systems are stable or negative.  OBJECTIVE: APPEARANCE:  Patient in no acute distress.The patient appeared well nourished and normally developed. Acyanotic. Waist: BP 202/90 Patient was given Clonidine 0.1 mg and BP checked every 15 minutes. BP 200/90 BP 190/85 VITAL SIGNS:BP 160/85  Pulse 68  Temp(Src) 97.4 F (36.3 C) (Oral)  Ht 5\' 4"  (1.626 m)  Wt 177 lb 6.4 oz (80.468 kg)  BMI 30.44 kg/m2 Mild obesity  SKIN: warm and  Dry without overt rashes, tattoos and scars  HEAD and Neck: without JVD, Head and scalp: normal Eyes:No scleral icterus. Fundi normal, eye movements normal. Ears: Auricle normal, canal normal, Tympanic membranes normal, insufflation normal. Nose: normal Throat: normal Neck & thyroid: normal  CHEST & LUNGS: Chest wall: normal Lungs: Clear  CVS: Reveals the PMI to be normally located. Regular rhythm, First and Second Heart sounds are normal,  absence of murmurs, rubs or gallops. Peripheral vasculature: Radial pulses: normal Dorsal pedis pulses: normal Posterior pulses: normal  ABDOMEN:  Appearance: normal Benign,, no organomegaly, no masses, no Abdominal Aortic enlargement. No Guarding , no rebound. No Bruits. Bowel sounds: normal  RECTAL: N/A GU: N/A  EXTREMETIES:  nonedematous. Both Femoral and Pedal pulses are normal  NEUROLOGIC: oriented to time,place and person; nonfocal.  ASSESSMENT: HTN (hypertension)  HLD (hyperlipidemia)  DM (diabetes mellitus)  Afib  Type II or unspecified type diabetes mellitus without mention of complication, not stated as uncontrolled - Plan: metFORMIN (GLUCOPHAGE) 500 MG tablet   PLAN:  Clonidine 0.1 mg administered and ongoing monitoring of BP for 1 hour.  Results for orders placed in visit on 08/01/12  POCT INR      Result Value Range   INR 2.1     Meds ordered this encounter  Medications  . metFORMIN (GLUCOPHAGE) 500 MG tablet    Sig: Take 1 tablet (500 mg total) by mouth 2 (two) times daily with a meal.    Dispense:  60 tablet    Refill:  3  . nebivolol 20 MG TABS    Sig: Take 1 tablet (20 mg total) by mouth daily.    Dispense:  30 tablet    Refill:  3  meds adjusted. The nebivolol was increased.   RTc in 1 week to check BP  Kota Ciancio P. [place] Charon, M.D.

## 2012-08-17 ENCOUNTER — Encounter: Payer: Self-pay | Admitting: Family Medicine

## 2012-08-17 ENCOUNTER — Ambulatory Visit (INDEPENDENT_AMBULATORY_CARE_PROVIDER_SITE_OTHER): Payer: Medicare Other | Admitting: Family Medicine

## 2012-08-17 VITALS — BP 203/84 | HR 61 | Temp 97.3°F | Ht 64.0 in | Wt 177.0 lb

## 2012-08-17 DIAGNOSIS — I10 Essential (primary) hypertension: Secondary | ICD-10-CM

## 2012-08-17 MED ORDER — AMLODIPINE BESYLATE 5 MG PO TABS: 5.0000 mg | ORAL_TABLET | Freq: Every day | ORAL | Status: DC

## 2012-08-17 NOTE — Progress Notes (Signed)
Patient ID: Samantha Compton, female   DOB: [birth year], 79 y.o.   MRN: [id number] SUBJECTIVE: HPI: Patient is here for follow up of hypertension: recheck after medication adjustment.  denies Headache;deniesChest Pain;denies weakness;denies Shortness of Breath or Orthopnea;denies Visual changes;denies palpitations;denies cough;denies pedal edema;denies symptoms of TIA or stroke; admits to Compliance with medications. denies Problems with medications.   PMH/PSH: reviewed/updated in Epic  SH/FH: reviewed/updated in Epic  Allergies: reviewed/updated in Epic  Medications: reviewed/updated in Epic  Immunizations: reviewed/updated in Epic  ROS: As above in the HPI. All other systems are stable or negative.  OBJECTIVE: APPEARANCE:  Patient in no acute distress.The patient appeared well nourished and normally developed. Acyanotic. Waist: VITAL SIGNS:BP 203/84  Pulse 61  Temp(Src) 97.3 F (36.3 C) (Oral)  Ht 5\' 4"  (1.626 m)  Wt 177 lb (80.287 kg)  BMI 30.37 kg/m2 Recheck BP 195/98  SKIN: warm and  Dry without overt rashes, tattoos and scars  HEAD and Neck: without JVD, Head and scalp: normal Eyes:No scleral icterus. Fundi normal, eye movements normal. Ears: Auricle normal, canal normal, Tympanic membranes normal, insufflation normal. Nose: normal Throat: normal Neck & thyroid: normal  CHEST & LUNGS: Chest wall: normal Lungs: Clear  CVS: Reveals the PMI to be normally located. Regular rhythm, First and Second Heart sounds are normal,  absence of murmurs, rubs or gallops. Peripheral vasculature: Radial pulses: normal Dorsal pedis pulses: normal Posterior pulses: normal  ABDOMEN:  Appearance: normal Benign,, no organomegaly, no masses, no Abdominal Aortic enlargement. No Guarding , no rebound. No Bruits. Bowel sounds: normal  RECTAL: N/A GU: N/A  EXTREMETIES: nonedematous.  MUSCULOSKELETAL:  Spine: normal  NEUROLOGIC: oriented to time,place and person;  nonfocal.   ASSESSMENT: HTN (hypertension) - Plan: amLODipine (NORVASC) 5 MG tablet  BP still high.  PLAN: No orders of the defined types were placed in this encounter.   Results for orders placed in visit on 08/01/12  POCT INR      Result Value Range   INR 2.1     Meds ordered this encounter  Medications  . amLODipine (NORVASC) 5 MG tablet    Sig: Take 1 tablet (5 mg total) by mouth daily.    Dispense:  30 tablet    Refill:  3   RTc in  1week  Krishiv Sandler P. [place] Charon, M.D.

## 2012-08-24 ENCOUNTER — Ambulatory Visit (INDEPENDENT_AMBULATORY_CARE_PROVIDER_SITE_OTHER): Payer: Medicare Other | Admitting: Family Medicine

## 2012-08-24 ENCOUNTER — Encounter: Payer: Self-pay | Admitting: Family Medicine

## 2012-08-24 VITALS — BP 175/70 | HR 53 | Temp 97.0°F | Wt 177.6 lb

## 2012-08-24 DIAGNOSIS — E785 Hyperlipidemia, unspecified: Secondary | ICD-10-CM

## 2012-08-24 DIAGNOSIS — M109 Gout, unspecified: Secondary | ICD-10-CM

## 2012-08-24 DIAGNOSIS — I4891 Unspecified atrial fibrillation: Secondary | ICD-10-CM

## 2012-08-24 DIAGNOSIS — I10 Essential (primary) hypertension: Secondary | ICD-10-CM

## 2012-08-24 DIAGNOSIS — E119 Type 2 diabetes mellitus without complications: Secondary | ICD-10-CM

## 2012-08-24 NOTE — Progress Notes (Signed)
Patient ID: Samantha Compton, female   DOB: 1933/12/01, 79 y.o.   MRN: [id number] SUBJECTIVE: HPI: Patient here for  Recheck of her BP. Depends on samples for her Bystolic. Patient is here for follow up of hypertension:  denies Headache;deniesChest Pain;denies weakness;denies Shortness of Breath or Orthopnea;denies Visual changes;denies palpitations;denies cough;denies pedal edema;denies symptoms of TIA or stroke; admits to Compliance with medications. denies Problems with medications.   PMH/PSH: reviewed/updated in Epic  SH/FH: reviewed/updated in Epic  Allergies: reviewed/updated in Epic  Medications: reviewed/updated in Epic  Immunizations: reviewed/updated in Epic  ROS: As above in the HPI. All other systems are stable or negative.  OBJECTIVE: APPEARANCE:  Patient in no acute distress.The patient appeared well nourished and normally developed. Acyanotic. Waist: VITAL SIGNS:BP 175/70  Pulse 53  Temp(Src) 97 F (36.1 C) (Oral)  Wt 177 lb 9.6 oz (80.559 kg)  BMI 30.47 kg/m2 Obese WF BP 160/70patient very impatient and wants to leave. She says her BPs are now better and fine at home but she doesn't check it regularly. SKIN: warm and  Dry without overt rashes, tattoos and scars  HEAD and Neck: without JVD, Head and scalp: normal Eyes:No scleral icterus. Fundi normal, eye movements normal. Ears: Auricle normal, canal normal, Tympanic membranes normal, insufflation normal. Nose: normal Throat: normal Neck & thyroid: normal  CHEST & LUNGS: Chest wall: normal Lungs: Clear  CVS: Reveals the PMI to be normally located. Regular rhythm, First and Second Heart sounds are normal,  absence of murmurs, rubs or gallops. Peripheral vasculature: Radial pulses: normal Dorsal pedis pulses: normal Posterior pulses: normal  ABDOMEN:  Appearance:Obese Benign,, no organomegaly, no masses, no Abdominal Aortic enlargement. No Guarding , no rebound. No Bruits. Bowel sounds:  normal  RECTAL: N/A GU: N/A  EXTREMETIES: nonedematous..  MUSCULOSKELETAL:  Spine: normal  NEUROLOGIC: oriented to time,place and person; nonfocal.  ASSESSMENT: HTN (hypertension)  HLD (hyperlipidemia)  DM (diabetes mellitus)  Gout  Afib  PLAN: Patient to check her BPs and record daily and RTC in 4 weeks with the readings. Low salt diet. Gave her a bag full of samples of bystolic to use. Compliance with meds discussed RTc in 4 weeks.  Renad Jenniges P. [place] Charon, M.D.

## 2012-08-25 ENCOUNTER — Encounter: Payer: Self-pay | Admitting: *Deleted

## 2012-08-28 ENCOUNTER — Telehealth: Payer: Self-pay | Admitting: *Deleted

## 2012-08-28 NOTE — Telephone Encounter (Signed)
Pt has 3+ protein in urine per advanced nurse

## 2012-08-29 ENCOUNTER — Ambulatory Visit: Payer: Medicare Other | Admitting: Family Medicine

## 2012-08-30 NOTE — Telephone Encounter (Signed)
Will re-evaluate at follow up. Patient is HTN and DM and is already on an ACE inhibitor that should help. Thanks. FW

## 2012-08-30 NOTE — Telephone Encounter (Signed)
fpw- please advise

## 2012-08-31 NOTE — Telephone Encounter (Signed)
Spoke with Samantha Compton and she is aware this will be addressed at next follow up visit

## 2012-09-19 ENCOUNTER — Ambulatory Visit (INDEPENDENT_AMBULATORY_CARE_PROVIDER_SITE_OTHER): Payer: Medicare Other | Admitting: Pharmacist Clinician (PhC)/ Clinical Pharmacy Specialist

## 2012-09-19 ENCOUNTER — Other Ambulatory Visit: Payer: Self-pay | Admitting: Nurse Practitioner

## 2012-09-19 DIAGNOSIS — I4891 Unspecified atrial fibrillation: Secondary | ICD-10-CM

## 2012-10-05 ENCOUNTER — Ambulatory Visit: Payer: Medicare Other | Admitting: Family Medicine

## 2012-10-09 ENCOUNTER — Other Ambulatory Visit: Payer: Self-pay | Admitting: *Deleted

## 2012-10-09 MED ORDER — POTASSIUM CHLORIDE CRYS ER 20 MEQ PO TBCR: 20.0000 meq | EXTENDED_RELEASE_TABLET | Freq: Every day | ORAL | Status: DC

## 2012-10-12 ENCOUNTER — Ambulatory Visit (INDEPENDENT_AMBULATORY_CARE_PROVIDER_SITE_OTHER): Payer: Medicare Other | Admitting: General Practice

## 2012-10-12 ENCOUNTER — Telehealth: Payer: Self-pay | Admitting: General Practice

## 2012-10-12 ENCOUNTER — Encounter: Payer: Self-pay | Admitting: General Practice

## 2012-10-12 VITALS — BP 197/80 | HR 56 | Temp 96.8°F | Ht 64.0 in | Wt 175.0 lb

## 2012-10-12 DIAGNOSIS — E119 Type 2 diabetes mellitus without complications: Secondary | ICD-10-CM

## 2012-10-12 DIAGNOSIS — E039 Hypothyroidism, unspecified: Secondary | ICD-10-CM

## 2012-10-12 DIAGNOSIS — I10 Essential (primary) hypertension: Secondary | ICD-10-CM

## 2012-10-12 DIAGNOSIS — E785 Hyperlipidemia, unspecified: Secondary | ICD-10-CM

## 2012-10-12 DIAGNOSIS — Z09 Encounter for follow-up examination after completed treatment for conditions other than malignant neoplasm: Secondary | ICD-10-CM

## 2012-10-12 DIAGNOSIS — N390 Urinary tract infection, site not specified: Secondary | ICD-10-CM

## 2012-10-12 DIAGNOSIS — R809 Proteinuria, unspecified: Secondary | ICD-10-CM

## 2012-10-12 LAB — POCT URINALYSIS DIPSTICK
Bilirubin, UA: NEGATIVE
Ketones, UA: NEGATIVE
Protein, UA: 100
Spec Grav, UA: 1.015
pH, UA: 7.5

## 2012-10-12 LAB — POCT UA - MICROSCOPIC ONLY
Bacteria, U Microscopic: NEGATIVE
Casts, Ur, LPF, POC: NEGATIVE
Crystals, Ur, HPF, POC: NEGATIVE
Yeast, UA: NEGATIVE

## 2012-10-12 LAB — POCT CBC
Granulocyte percent: 65.3 %G (ref 37–80)
Hemoglobin: 14.1 g/dL (ref 12.2–16.2)
Lymph, poc: 3.8 — AB (ref 0.6–3.4)
MCH, POC: 31.9 pg — AB (ref 27–31.2)
MCV: 89.3 fL (ref 80–97)
Platelet Count, POC: 268 10*3/uL (ref 142–424)
RBC: 4.4 M/uL (ref 4.04–5.48)

## 2012-10-12 MED ORDER — NITROFURANTOIN MONOHYD MACRO 100 MG PO CAPS: 100.0000 mg | ORAL_CAPSULE | Freq: Two times a day (BID) | ORAL | Status: DC

## 2012-10-12 NOTE — Telephone Encounter (Signed)
Please contact patient and inform of UTI and medication called into pharmacy. There is no action needed at this time for protein in urine, we well monitor periodically and if symptoms develop. She should take medications as prescribed as discussed in office visit. thx

## 2012-10-12 NOTE — Patient Instructions (Addendum)
Metformin tablets What is this medicine? METFORMIN (met FOR min) is used to treat type 2 diabetes. It helps to control blood sugar. Treatment is combined with diet and exercise. This medicine can be used alone or with other medicines for diabetes. This medicine may be used for other purposes; ask your health care provider or pharmacist if you have questions. What should I tell my health care provider before I take this medicine? They need to know if you have any of these conditions: -anemia -frequently drink alcohol-containing beverages -become easily dehydrated -heart attack -heart failure that is treated with medications -kidney disease -liver disease -polycystic ovary syndrome -serious infection or injury -vomiting -an unusual or allergic reaction to metformin, other medicines, foods, dyes, or preservatives -pregnant or trying to get pregnant -breast-feeding How should I use this medicine? Take this medicine by mouth. Take it with meals. Swallow the tablets with a drink of water. Follow the directions on the prescription label. Take your medicine at regular intervals. Do not take your medicine more often than directed. Talk to your pediatrician regarding the use of this medicine in children. While this drug may be prescribed for children as young as 16 years of age for selected conditions, precautions do apply. Overdosage: If you think you have taken too much of this medicine contact a poison control center or emergency room at once. NOTE: This medicine is only for you. Do not share this medicine with others. What if I miss a dose? If you miss a dose, take it as soon as you can. If it is almost time for your next dose, take only that dose. Do not take double or extra doses. What may interact with this medicine? Do not take this medicine with any of the following medications: -dofetilide -gatifloxacin -certain contrast medicines given before X-rays, CT scans, MRI, or other  procedures This medicine may also interact with the following medications: -digoxin -diuretics -female hormones, like estrogens or progestins and birth control pills -isoniazid -medicines for blood pressure, heart disease, irregular heart beat -morphine -nicotinic acid -phenothiazines like chlorpromazine, mesoridazine, prochlorperazine, thioridazine -phenytoin -procainamide -quinidine -quinine -ranitidine -steroid medicines like prednisone or cortisone -stimulant medicines for attention disorders, weight loss, or to stay awake -thyroid medicines -trimethoprim -vancomycin This list may not describe all possible interactions. Give your health care provider a list of all the medicines, herbs, non-prescription drugs, or dietary supplements you use. Also tell them if you smoke, drink alcohol, or use illegal drugs. Some items may interact with your medicine. What should I watch for while using this medicine? Visit your doctor or health care professional for regular checks on your progress. Learn how to check your blood sugar. Learn the symptoms of low and high blood sugar and how to manage them. If you have low blood sugar, eat or drink something that has sugar. Make sure others know to get medical help quickly if you have serious symptoms of low blood sugar, like if you become unconscious or have a seizure. If you need surgery or if you will need a procedure with contrast drugs, tell your doctor or health care professional that you are taking this medicine. Wear a medical identification bracelet or chain to say you have diabetes, and carry a card that lists all your medications. What side effects may I notice from receiving this medicine? Side effects that you should report to your doctor or health care professional as soon as possible: -allergic reactions like skin rash, itching or hives, swelling of the face,  lips, or tongue -breathing problems -feeling faint or lightheaded, falls -low  blood sugar (ask your doctor or health care professional for a list of these symptoms) -muscle aches or pains -slow or irregular heartbeat -unusual stomach pain or discomfort -unusually tired or weak Side effects that usually do not require medical attention (report to your doctor or health care professional if they continue or are bothersome): -diarrhea -headache -heartburn -metallic taste in mouth -nausea -stomach gas, upset This list may not describe all possible side effects. Call your doctor for medical advice about side effects. You may report side effects to FDA at 1-800-FDA-1088. Where should I keep my medicine? Keep out of the reach of children. Store at room temperature between 15 and 30 degrees C (59 and 86 degrees F). Protect from moisture and light. Throw away any unused medicine after the expiration date. NOTE: This sheet is a summary. It may not cover all possible information. If you have questions about this medicine, talk to your doctor, pharmacist, or health care provider.  2012, [employer]. (10/02/2007 3:40:54 PM)

## 2012-10-12 NOTE — Progress Notes (Signed)
Subjective:    Patient ID: Samantha Compton, female    DOB: 1933-11-09, 79 y.o.   MRN: [id number]  HPI Patient presents today with complaints of diarrhea, since starting metformin. She denies having diarrhea every day. She reports having three loose stools daily about two days a week. She denies interference with daily activities. She denies weakness. She reports having protein in urine when assessed by home health nurse two weeks ago and is following up with this office today. Patient has a history of hypertension, diabetes, gout, afib, hyperlipidemia. She reports taking medications as prescribed, but didn't take today because of this appointment.   Patient denies checking blood pressure at home, although she has blood pressure machine there. Patient reports understanding of how to use machine. Reports taking blood sugar periodically at home and this am it was 159 fasting.    Review of Systems  Constitutional: Negative for fever and chills.  HENT: Negative for neck pain and neck stiffness.   Respiratory: Negative for chest tightness and shortness of breath.   Cardiovascular: Negative for chest pain and palpitations.  Gastrointestinal: Negative for vomiting, abdominal pain, diarrhea and blood in stool.       Diarrhea twice a week  Genitourinary: Negative for dysuria, frequency, hematuria and difficulty urinating.  Neurological: Negative for dizziness, weakness and headaches.       Objective:   Physical Exam  Constitutional: She is oriented to person, place, and time. She appears well-developed and well-nourished.  Cardiovascular: Regular rhythm and normal heart sounds.  Bradycardia present.   No murmur heard. Pulmonary/Chest: Effort normal and breath sounds normal. No respiratory distress. She exhibits no tenderness.  Abdominal: Soft. Bowel sounds are normal. She exhibits no distension. There is no tenderness.  Neurological: She is alert and oriented to person, place, and time.  Skin: Skin is  warm and dry.  Psychiatric: She has a normal mood and affect.   Results for orders placed in visit on 10/12/12  POCT URINALYSIS DIPSTICK      Result Value Range   Color, UA yellow     Clarity, UA clear     Glucose, UA neg     Bilirubin, UA neg     Ketones, UA neg     Spec Grav, UA 1.015     Blood, UA trace     pH, UA 7.5     Protein, UA 100     Urobilinogen, UA negative     Nitrite, UA neg     Leukocytes, UA Trace    POCT UA - MICROSCOPIC ONLY      Result Value Range   WBC, Ur, HPF, POC 10-15     RBC, urine, microscopic 1-5     Bacteria, U Microscopic neg     Mucus, UA occ     Epithelial cells, urine per micros occ     Crystals, Ur, HPF, POC neg     Casts, Ur, LPF, POC neg     Yeast, UA neg    POCT UA - MICROALBUMIN      Result Value Range   Microalbumin Ur, POC 20    POCT CBC      Result Value Range   WBC 12.1 (*) 4.6 - 10.2 K/uL   Lymph, poc 3.8 (*) 0.6 - 3.4   POC LYMPH PERCENT 31.6  10 - 50 %L   POC Granulocyte 7.9 (*) 2 - 6.9   Granulocyte percent 65.3  37 - 80 %G   RBC 4.4  4.04 -  5.48 M/uL   Hemoglobin 14.1  12.2 - 16.2 g/dL   HCT, POC 84.6  96.2 - 47.9 %   MCV 89.3  80 - 97 fL   MCH, POC 31.9 (*) 27 - 31.2 pg   MCHC 35.7 (*) 31.8 - 35.4 g/dL   RDW, POC 95.2     Platelet Count, POC 268.0  142 - 424 K/uL   MPV 6.6  0 - 99.8 fL          Assessment & Plan:  1. Proteinuria - POCT urinalysis dipstick - POCT UA - Microscopic Only - POCT UA - Microalbumin -Will continue to monitor protein in urine periodically, no action needed at this time -Patient to take medications for diabetes and blood pressure as prescribed  2. Essential hypertension, benign  3. Unspecified hypothyroidism - Thyroid Panel With TSH  4. Other and unspecified hyperlipidemia - NMR Lipoprofile with Lipids  5. Diabetes - POCT glycosylated hemoglobin (Hb A1C) - Microalbumin, urine  6. Follow-up exam, 3-6 months since previous exam - POCT CBC - COMPLETE METABOLIC PANEL WITH  GFR  7. UTI (urinary tract infection) - nitrofurantoin, macrocrystal-monohydrate, (MACROBID) 100 MG capsule; Take 1 capsule (100 mg total) by mouth 2 (two) times daily.  Dispense: 14 capsule; Refill: 0 -take medications as directed -RTO if symptoms develop and follow up urine in 2 weeks for UTI -Patient verbalized understanding Coralie Keens, FNP-C

## 2012-10-13 LAB — NMR LIPOPROFILE WITH LIPIDS
Cholesterol, Total: 264 mg/dL — ABNORMAL HIGH (ref <200)
HDL Particle Number: 26 umol/L — ABNORMAL LOW (ref >=30.5)
HDL-C: 47 mg/dL (ref >=40)
LDL (calc): 183 mg/dL — ABNORMAL HIGH (ref <100)
LDL Particle Number: 2469 nmol/L — ABNORMAL HIGH (ref <1000)
LDL Size: 20.5 nm — ABNORMAL LOW (ref >20.5)
LP-IR Score: 49 — ABNORMAL HIGH (ref <=45)
Small LDL Particle Number: 1298 nmol/L — ABNORMAL HIGH (ref <=527)
VLDL Size: 39.1 nm (ref <=46.6)

## 2012-10-13 LAB — COMPLETE METABOLIC PANEL WITH GFR
ALT: 18 U/L (ref 0–35)
AST: 17 U/L (ref 0–37)
Calcium: 10 mg/dL (ref 8.4–10.5)
Chloride: 102 mEq/L (ref 96–112)
Creat: 1.07 mg/dL (ref 0.50–1.10)
GFR, Est African American: 58 mL/min — ABNORMAL LOW
GFR, Est Non African American: 50 mL/min — ABNORMAL LOW
Sodium: 143 mEq/L (ref 135–145)
Total Bilirubin: 0.6 mg/dL (ref 0.3–1.2)
Total Protein: 7.5 g/dL (ref 6.0–8.3)

## 2012-10-13 LAB — THYROID PANEL WITH TSH
T4, Total: 10 ug/dL (ref 5.0–12.5)
TSH: 2.831 u[IU]/mL (ref 0.350–4.500)

## 2012-10-16 NOTE — Telephone Encounter (Signed)
Pt aware.

## 2012-10-17 ENCOUNTER — Other Ambulatory Visit: Payer: Self-pay | Admitting: General Practice

## 2012-10-17 DIAGNOSIS — E785 Hyperlipidemia, unspecified: Secondary | ICD-10-CM

## 2012-10-17 MED ORDER — ROSUVASTATIN CALCIUM 20 MG PO TABS: 20.0000 mg | ORAL_TABLET | Freq: Every day | ORAL | Status: DC

## 2012-10-17 NOTE — Progress Notes (Signed)
Pt aware.

## 2012-10-24 ENCOUNTER — Ambulatory Visit (INDEPENDENT_AMBULATORY_CARE_PROVIDER_SITE_OTHER): Payer: Medicare Other | Admitting: Pharmacist Clinician (PhC)/ Clinical Pharmacy Specialist

## 2012-10-24 ENCOUNTER — Other Ambulatory Visit: Payer: Self-pay | Admitting: General Practice

## 2012-10-24 VITALS — BP 160/58

## 2012-10-24 DIAGNOSIS — I4891 Unspecified atrial fibrillation: Secondary | ICD-10-CM

## 2012-10-24 DIAGNOSIS — R809 Proteinuria, unspecified: Secondary | ICD-10-CM

## 2012-10-24 DIAGNOSIS — N390 Urinary tract infection, site not specified: Secondary | ICD-10-CM

## 2012-10-24 LAB — POCT URINALYSIS DIPSTICK
Bilirubin, UA: NEGATIVE
Glucose, UA: NEGATIVE
Ketones, UA: NEGATIVE
Spec Grav, UA: <=1.005

## 2012-10-24 NOTE — Addendum Note (Signed)
Addended by: Prescott Gum on: 10/24/2012 12:37 PM   Modules accepted: Orders

## 2012-10-24 NOTE — Addendum Note (Signed)
Addended by: Chari Manning on: 10/24/2012 10:09 AM   Modules accepted: Orders

## 2012-10-25 LAB — MICROALBUMIN, URINE: Microalbumin, Urine: 558 ug/mL — ABNORMAL HIGH (ref 0.0–17.0)

## 2012-11-02 ENCOUNTER — Encounter: Payer: Self-pay | Admitting: Family Medicine

## 2012-11-02 ENCOUNTER — Ambulatory Visit (INDEPENDENT_AMBULATORY_CARE_PROVIDER_SITE_OTHER): Payer: Medicare Other | Admitting: Family Medicine

## 2012-11-02 VITALS — BP 159/71 | HR 52 | Temp 97.8°F | Wt 173.8 lb

## 2012-11-02 DIAGNOSIS — N390 Urinary tract infection, site not specified: Secondary | ICD-10-CM

## 2012-11-02 DIAGNOSIS — E785 Hyperlipidemia, unspecified: Secondary | ICD-10-CM

## 2012-11-02 DIAGNOSIS — I10 Essential (primary) hypertension: Secondary | ICD-10-CM

## 2012-11-02 DIAGNOSIS — M109 Gout, unspecified: Secondary | ICD-10-CM

## 2012-11-02 DIAGNOSIS — E119 Type 2 diabetes mellitus without complications: Secondary | ICD-10-CM

## 2012-11-02 DIAGNOSIS — I4891 Unspecified atrial fibrillation: Secondary | ICD-10-CM

## 2012-11-02 LAB — POCT URINALYSIS DIPSTICK
Bilirubin, UA: NEGATIVE
Glucose, UA: NEGATIVE
Ketones, UA: NEGATIVE
Nitrite, UA: NEGATIVE
Spec Grav, UA: 1.02
Urobilinogen, UA: NEGATIVE
pH, UA: 7.5

## 2012-11-02 LAB — POCT UA - MICROSCOPIC ONLY
Crystals, Ur, HPF, POC: NEGATIVE
Yeast, UA: NEGATIVE

## 2012-11-02 MED ORDER — AMLODIPINE BESYLATE 5 MG PO TABS: 7.5000 mg | ORAL_TABLET | Freq: Every day | ORAL | Status: DC

## 2012-11-02 MED ORDER — LISINOPRIL-HYDROCHLOROTHIAZIDE 20-12.5 MG PO TABS: 2.0000 | ORAL_TABLET | Freq: Every day | ORAL | Status: DC

## 2012-11-02 MED ORDER — PRAVASTATIN SODIUM 40 MG PO TABS: 40.0000 mg | ORAL_TABLET | Freq: Every day | ORAL | Status: DC

## 2012-11-02 NOTE — Patient Instructions (Addendum)
      Dr Matthan Sledge's Recommendations  Diet and Exercise discussed with patient.  For nutrition information, I recommend books:  1).Eat to Live by Dr Joel Fuhrman. 2).Prevent and Reverse Heart Disease by Dr Caldwell Esselstyn. 3) Dr Neal Barnard's Book:  Program to Reverse Diabetes  Exercise recommendations are:  If unable to walk, then the patient can exercise in a chair 3 times a day. By flapping arms like a bird gently and raising legs outwards to the front.  If ambulatory, the patient can go for walks for 30 minutes 3 times a week. Then increase the intensity and duration as tolerated.  Goal is to try to attain exercise frequency to 5 times a week.  If applicable: Best to perform resistance exercises (machines or weights) 2 days a week and cardio type exercises 3 days per week.  

## 2012-11-02 NOTE — Progress Notes (Signed)
Patient ID: Samantha Compton, female   DOB: 08-23-1933, 79 y.o.   MRN: [id number] SUBJECTIVE: CC: Chief Complaint  Patient presents with  . Follow-up    1 month follow up    HPI: Patient is here for follow up of Diabetes Mellitus/HTN/HLD/Gout/A fib/ Symptoms evaluated Denies Nocturia ,Denies Urinary Frequency , denies Blurred vision ,deniesDizziness,denies.Dysuria,denies paresthesias, denies extremity pain or ulcers.Marland Kitchendenies chest pain. has had an annual eye exam. do check the feet. Does check CBGs. Average CBG:not checked by patient recently. Last HGBA1C=5.5 Denies episodes of hypoglycemia. Does have an emergency hypoglycemic plan. admits toCompliance with medications. Denies Problems with medications.  Cannot afford crestor.  Not taking care of her self because she is busy taking care of her husband.  Past Medical History  Diagnosis Date  . Dysrhythmia     AF  . Hypertension   . Diabetes mellitus   . Hyperlipemia   . Fibromyalgia   . Arthritis   . Hypothyroidism   . Chronic pain   . Rheumatic fever     hx  . Anxiety   . GERD (gastroesophageal reflux disease)     no meds now  . [id number](478.2)    Past Surgical History  Procedure Laterality Date  . Cholecystectomy    . Appendectomy    . Carpal tunnel release  2011    right and left 2011  . Ulnar nerve transposition  12/01/2011    Procedure: ULNAR NERVE DECOMPRESSION/TRANSPOSITION;  Surgeon: Nicki Reaper, MD;  [facility];  Service: Orthopedics;  Laterality: Left;  decompression possible transposition left ulnar nerve   History   Social History  . Marital Status: Married    Spouse Name: N/A    Number of Children: N/A  . Years of Education: N/A   Occupational History  . Not on file.   Social History Main Topics  . Smoking status: Former Smoker    Types: Cigarettes    Quit date: 07/06/1992  . Smokeless tobacco: Not on file  . Alcohol Use: No  . Drug Use: No  . Sexually Active: Not on  file   Other Topics Concern  . Not on file   Social History Narrative  . No narrative on file   No family history on file. Current Outpatient Prescriptions on File Prior to Visit  Medication Sig Dispense Refill  . amLODipine (NORVASC) 5 MG tablet Take 1 tablet (5 mg total) by mouth daily.  30 tablet  3  . chlorthalidone (HYGROTON) 25 MG tablet Take 25 mg by mouth daily.      . cholecalciferol (VITAMIN D) 1000 UNITS tablet Take 1,000 Units by mouth daily.      . flecainide (TAMBOCOR) 100 MG tablet Take 100 mg by mouth 2 (two) times daily.      Marland Kitchen glimepiride (AMARYL) 4 MG tablet Take 4 mg by mouth daily before breakfast.      . levothyroxine (SYNTHROID, LEVOTHROID) 50 MCG tablet Take 50 mcg by mouth daily.      Marland Kitchen lisinopril (PRINIVIL,ZESTRIL) 40 MG tablet Take 1 tablet (40 mg total) by mouth daily.  30 tablet  3  . metFORMIN (GLUCOPHAGE) 500 MG tablet Take 1 tablet (500 mg total) by mouth 2 (two) times daily with a meal.  60 tablet  3  . nebivolol 20 MG TABS Take 1 tablet (20 mg total) by mouth daily.  30 tablet  3  . potassium chloride SA (K-DUR,KLOR-CON) 20 MEQ tablet Take 1 tablet (20 mEq total) by mouth  daily.  30 tablet  1  . pyridOXINE (VITAMIN B-6) 25 MG tablet Take 25 mg by mouth daily.      . rosuvastatin (CRESTOR) 20 MG tablet Take 1 tablet (20 mg total) by mouth daily.  30 tablet  3  . ULORIC 40 MG tablet TAKE 1 TAB DAILY  30 tablet  4  . warfarin (COUMADIN) 4 MG tablet Take 4 mg by mouth daily. Takes 4mg  3 days-1/2 the other 4       No current facility-administered medications on file prior to visit.   Allergies  Allergen Reactions  . Penicillins Rash    There is no immunization history on file for this patient. Prior to Admission medications   Medication Sig Start Date End Date Taking? Authorizing Provider  amLODipine (NORVASC) 5 MG tablet Take 1 tablet (5 mg total) by mouth daily. 08/17/12  Yes Ileana Ladd, MD  chlorthalidone (HYGROTON) 25 MG tablet Take 25 mg by  mouth daily.   Yes Historical Provider, MD  cholecalciferol (VITAMIN D) 1000 UNITS tablet Take 1,000 Units by mouth daily.   Yes Historical Provider, MD  flecainide (TAMBOCOR) 100 MG tablet Take 100 mg by mouth 2 (two) times daily.   Yes Historical Provider, MD  glimepiride (AMARYL) 4 MG tablet Take 4 mg by mouth daily before breakfast.   Yes Historical Provider, MD  levothyroxine (SYNTHROID, LEVOTHROID) 50 MCG tablet Take 50 mcg by mouth daily.   Yes Historical Provider, MD  lisinopril (PRINIVIL,ZESTRIL) 40 MG tablet Take 1 tablet (40 mg total) by mouth daily. 08/01/12  Yes Chari Manning, RPH  metFORMIN (GLUCOPHAGE) 500 MG tablet Take 1 tablet (500 mg total) by mouth 2 (two) times daily with a meal. 08/10/12  Yes Ileana Ladd, MD  nebivolol 20 MG TABS Take 1 tablet (20 mg total) by mouth daily. 08/10/12  Yes Ileana Ladd, MD  potassium chloride SA (K-DUR,KLOR-CON) 20 MEQ tablet Take 1 tablet (20 mEq total) by mouth daily. 10/09/12  Yes Ileana Ladd, MD  pyridOXINE (VITAMIN B-6) 25 MG tablet Take 25 mg by mouth daily.   Yes Historical Provider, MD  rosuvastatin (CRESTOR) 20 MG tablet Take 1 tablet (20 mg total) by mouth daily. 10/17/12  Yes Mae Shelda Altes, FNP  ULORIC 40 MG tablet TAKE 1 TAB DAILY 09/19/12  Yes Ileana Ladd, MD  warfarin (COUMADIN) 4 MG tablet Take 4 mg by mouth daily. Takes 4mg  3 days-1/2 the other 4   Yes Historical Provider, MD    ROS: As above in the HPI. All other systems are stable or negative.  OBJECTIVE: APPEARANCE:  Patient in no acute distress.The patient appeared well nourished and normally developed. Acyanotic. Waist: VITAL SIGNS:BP 159/71  Pulse 52  Temp(Src) 97.8 F (36.6 C) (Oral)  Wt 173 lb 12.8 oz (78.835 kg)  BMI 29.82 kg/m2 WF elderly  SKIN: warm and  Dry without overt rashes, tattoos and scars  HEAD and Neck: without JVD, Head and scalp: normal Eyes:No scleral icterus. Fundi normal, eye movements normal. Ears: Auricle normal, canal  normal, Tympanic membranes normal, insufflation normal. Nose: normal Throat: normal Neck & thyroid: normal  CHEST & LUNGS: Chest wall: normal Lungs: Clear  CVS: Reveals the PMI to be normally located. Regular rhythm, First and Second Heart sounds are normal,  absence of murmurs, rubs or gallops. Peripheral vasculature: Radial pulses: normal Dorsal pedis pulses: normal Posterior pulses: normal  ABDOMEN:  Appearance: normal Benign, no organomegaly, no masses, no Abdominal Aortic enlargement. No  Guarding , no rebound. No Bruits. Bowel sounds: normal  RECTAL: N/A GU: N/A  EXTREMETIES: nonedematous. Both Femoral and Pedal pulses are normal.  MUSCULOSKELETAL:  Spine: normal Joints: intact  NEUROLOGIC: oriented to time,place and person; nonfocal. Strength is normal Sensory is normal Reflexes are normal Cranial Nerves are normal.  ASSESSMENT: DM (diabetes mellitus)  HLD (hyperlipidemia) - Plan: pravastatin (PRAVACHOL) 40 MG tablet  HTN (hypertension) - Plan: lisinopril-hydrochlorothiazide (PRINZIDE,ZESTORETIC) 20-12.5 MG per tablet, amLODipine (NORVASC) 5 MG tablet  Afib  Gout  UTI (urinary tract infection) - Plan: POCT UA - Microscopic Only, POCT urinalysis dipstick, Urine culture  meds changed due to cost Bp not at goal will increase the amlodipine to 1 1/2 tabs daily. PLAN: Orders Placed This Encounter  Procedures  . Urine culture  . POCT UA - Microscopic Only  . POCT urinalysis dipstick    Meds ordered this encounter  Medications  . pravastatin (PRAVACHOL) 40 MG tablet    Sig: Take 1 tablet (40 mg total) by mouth daily.    Dispense:  30 tablet    Refill:  5  . lisinopril-hydrochlorothiazide (PRINZIDE,ZESTORETIC) 20-12.5 MG per tablet    Sig: Take 2 tablets by mouth daily.    Dispense:  60 tablet    Refill:  11  . amLODipine (NORVASC) 5 MG tablet    Sig: Take 1.5 tablets (7.5 mg total) by mouth daily.    Dispense:  45 tablet    Refill:  5    Results for orders placed in visit on 11/02/12  POCT UA - MICROSCOPIC ONLY      Result Value Range   WBC, Ur, HPF, POC 1-5     RBC, urine, microscopic 3-5     Bacteria, U Microscopic mod     Mucus, UA mod     Epithelial cells, urine per micros few     Crystals, Ur, HPF, POC neg     Casts, Ur, LPF, POC occ     Yeast, UA neg    POCT URINALYSIS DIPSTICK      Result Value Range   Color, UA yellow     Clarity, UA clear     Glucose, UA neg     Bilirubin, UA neg     Ketones, UA neg     Spec Grav, UA 1.020     Blood, UA trace     pH, UA 7.5     Protein, UA large     Urobilinogen, UA negative     Nitrite, UA neg     Leukocytes, UA Trace      .     Dr Woodroe Mode Recommendations  Diet and Exercise discussed with patient.  For nutrition information, I recommend books:  1).Eat to Live by Dr Monico Hoar. 2).Prevent and Reverse Heart Disease by Dr Suzzette Righter. 3) Dr Katherina Right Book:  Program to Reverse Diabetes  Exercise recommendations are:  If unable to walk, then the patient can exercise in a chair 3 times a day. By flapping arms like a bird gently and raising legs outwards to the front.  If ambulatory, the patient can go for walks for 30 minutes 3 times a week. Then increase the intensity and duration as tolerated.  Goal is to try to attain exercise frequency to 5 times a week.  If applicable: Best to perform resistance exercises (machines or weights) 2 days a week and cardio type exercises 3 days per week.  Return in about 2 months (around 01/02/2013).  Thelma Barge  Casimiro Needle, M.D.  .

## 2012-11-12 NOTE — Progress Notes (Signed)
Quick Note:  Labs abnormal. Awaiting the urine culture. Lab to research the results.  ______

## 2012-11-14 ENCOUNTER — Telehealth: Payer: Self-pay | Admitting: Family Medicine

## 2012-11-14 LAB — URINE CULTURE

## 2012-11-14 NOTE — Telephone Encounter (Signed)
Patient aware.

## 2012-12-12 ENCOUNTER — Encounter: Payer: Self-pay | Admitting: Family Medicine

## 2012-12-12 ENCOUNTER — Other Ambulatory Visit: Payer: Self-pay | Admitting: Family Medicine

## 2012-12-12 ENCOUNTER — Ambulatory Visit (INDEPENDENT_AMBULATORY_CARE_PROVIDER_SITE_OTHER): Payer: Medicare Other

## 2012-12-12 ENCOUNTER — Ambulatory Visit (INDEPENDENT_AMBULATORY_CARE_PROVIDER_SITE_OTHER): Payer: Medicare Other | Admitting: Family Medicine

## 2012-12-12 VITALS — BP 161/66 | HR 53 | Temp 97.6°F | Wt 171.0 lb

## 2012-12-12 DIAGNOSIS — I4891 Unspecified atrial fibrillation: Secondary | ICD-10-CM

## 2012-12-12 DIAGNOSIS — R0609 Other forms of dyspnea: Secondary | ICD-10-CM

## 2012-12-12 DIAGNOSIS — R600 Localized edema: Secondary | ICD-10-CM | POA: Insufficient documentation

## 2012-12-12 DIAGNOSIS — R0600 Dyspnea, unspecified: Secondary | ICD-10-CM | POA: Insufficient documentation

## 2012-12-12 DIAGNOSIS — I10 Essential (primary) hypertension: Secondary | ICD-10-CM

## 2012-12-12 DIAGNOSIS — R609 Edema, unspecified: Secondary | ICD-10-CM

## 2012-12-12 LAB — POCT INR: INR: 2.1

## 2012-12-12 MED ORDER — TERAZOSIN HCL 1 MG PO CAPS: 1.0000 mg | ORAL_CAPSULE | Freq: Every day | ORAL | Status: DC

## 2012-12-12 MED ORDER — AMLODIPINE BESYLATE 5 MG PO TABS: 5.0000 mg | ORAL_TABLET | Freq: Every day | ORAL | Status: DC

## 2012-12-12 MED ORDER — LISINOPRIL-HYDROCHLOROTHIAZIDE 20-12.5 MG PO TABS: 2.0000 | ORAL_TABLET | Freq: Every day | ORAL | Status: DC

## 2012-12-12 NOTE — Progress Notes (Signed)
Patient ID: Samantha Compton, female   DOB: [birth year], 79 y.o.   MRN: [id number] SUBJECTIVE: CC: Chief Complaint  Patient presents with  . Edema    several weeks noticed swelling   . Medication Refill    chlorthalidone,,glimepride,levothyroine,flecainide     HPI: Patient is here for follow up of hypertension/HLD/DM: denies Headache;deniesChest Pain;denies weakness;denies Shortness of Breath or Orthopnea;denies Visual changes;denies palpitations;denies cough;denies pedal edema;denies symptoms of TIA or stroke; admits to Compliance with medications. denies Problems with medications.  pedal edema: worse  Past Medical History  Diagnosis Date  . Dysrhythmia     AF  . Hypertension   . Diabetes mellitus   . Hyperlipemia   . Fibromyalgia   . Arthritis   . Hypothyroidism   . Chronic pain   . Rheumatic fever     hx  . Anxiety   . GERD (gastroesophageal reflux disease)     no meds now  . YQMVHQIO(962.9)    Past Surgical History  Procedure Laterality Date  . Cholecystectomy    . Appendectomy    . Carpal tunnel release  2011    right and left 2011  . Ulnar nerve transposition  12/01/2011    Procedure: ULNAR NERVE DECOMPRESSION/TRANSPOSITION;  Surgeon: Nicki Reaper, MD;  Location: [facility];  Service: Orthopedics;  Laterality: Left;  decompression possible transposition left ulnar nerve   History   Social History  . Marital Status: Married    Spouse Name: N/A    Number of Children: N/A  . Years of Education: N/A   Occupational History  . Not on file.   Social History Main Topics  . Smoking status: Former Smoker    Types: Cigarettes    Quit date: 07/06/1992  . Smokeless tobacco: Not on file  . Alcohol Use: No  . Drug Use: No  . Sexual Activity: Not on file   Other Topics Concern  . Not on file   Social History Narrative  . No narrative on file   History reviewed. No pertinent family history. Current Outpatient Prescriptions on File Prior to Visit   Medication Sig Dispense Refill  . amLODipine (NORVASC) 5 MG tablet Take 1.5 tablets (7.5 mg total) by mouth daily.  45 tablet  5  . cholecalciferol (VITAMIN D) 1000 UNITS tablet Take 1,000 Units by mouth daily.      . flecainide (TAMBOCOR) 100 MG tablet Take 100 mg by mouth 2 (two) times daily.      Marland Kitchen glimepiride (AMARYL) 4 MG tablet Take 4 mg by mouth daily before breakfast.      . levothyroxine (SYNTHROID, LEVOTHROID) 50 MCG tablet Take 50 mcg by mouth daily.      Marland Kitchen lisinopril-hydrochlorothiazide (PRINZIDE,ZESTORETIC) 20-12.5 MG per tablet Take 2 tablets by mouth daily.  60 tablet  11  . metFORMIN (GLUCOPHAGE) 500 MG tablet Take 1 tablet (500 mg total) by mouth 2 (two) times daily with a meal.  60 tablet  3  . nebivolol 20 MG TABS Take 1 tablet (20 mg total) by mouth daily.  30 tablet  3  . potassium chloride SA (K-DUR,KLOR-CON) 20 MEQ tablet Take 1 tablet (20 mEq total) by mouth daily.  30 tablet  1  . pravastatin (PRAVACHOL) 40 MG tablet Take 1 tablet (40 mg total) by mouth daily.  30 tablet  5  . pyridOXINE (VITAMIN B-6) 25 MG tablet Take 25 mg by mouth daily.      Marland Kitchen ULORIC 40 MG tablet TAKE 1 TAB DAILY  30 tablet  4  . warfarin (COUMADIN) 4 MG tablet Take 4 mg by mouth daily. Takes 4mg  3 days-1/2 the other 4       No current facility-administered medications on file prior to visit.   Allergies  Allergen Reactions  . Penicillins Rash    There is no immunization history on file for this patient. Prior to Admission medications   Medication Sig Start Date End Date Taking? Authorizing Provider  amLODipine (NORVASC) 5 MG tablet Take 1.5 tablets (7.5 mg total) by mouth daily. 11/02/12  Yes Ileana Ladd, MD  cholecalciferol (VITAMIN D) 1000 UNITS tablet Take 1,000 Units by mouth daily.   Yes Historical Provider, MD  flecainide (TAMBOCOR) 100 MG tablet Take 100 mg by mouth 2 (two) times daily.   Yes Historical Provider, MD  glimepiride (AMARYL) 4 MG tablet Take 4 mg by mouth daily before  breakfast.   Yes Historical Provider, MD  levothyroxine (SYNTHROID, LEVOTHROID) 50 MCG tablet Take 50 mcg by mouth daily.   Yes Historical Provider, MD  lisinopril-hydrochlorothiazide (PRINZIDE,ZESTORETIC) 20-12.5 MG per tablet Take 2 tablets by mouth daily. 11/02/12  Yes Ileana Ladd, MD  metFORMIN (GLUCOPHAGE) 500 MG tablet Take 1 tablet (500 mg total) by mouth 2 (two) times daily with a meal. 08/10/12  Yes Ileana Ladd, MD  nebivolol 20 MG TABS Take 1 tablet (20 mg total) by mouth daily. 08/10/12  Yes Ileana Ladd, MD  potassium chloride SA (K-DUR,KLOR-CON) 20 MEQ tablet Take 1 tablet (20 mEq total) by mouth daily. 10/09/12  Yes Ileana Ladd, MD  pravastatin (PRAVACHOL) 40 MG tablet Take 1 tablet (40 mg total) by mouth daily. 11/02/12  Yes Ileana Ladd, MD  pyridOXINE (VITAMIN B-6) 25 MG tablet Take 25 mg by mouth daily.   Yes Historical Provider, MD  ULORIC 40 MG tablet TAKE 1 TAB DAILY 09/19/12  Yes Ileana Ladd, MD  warfarin (COUMADIN) 4 MG tablet Take 4 mg by mouth daily. Takes 4mg  3 days-1/2 the other 4   Yes Historical Provider, MD    ROS: As above in the HPI. All other systems are stable or negative.  OBJECTIVE: APPEARANCE:  Patient in no acute distress.The patient appeared well nourished and normally developed. Acyanotic. Waist: VITAL SIGNS:BP 161/66  Pulse 53  Temp(Src) 97.6 F (36.4 C) (Oral)  Wt 171 lb (77.565 kg)  BMI 29.34 kg/m2 WF obese  SKIN: warm and  Dry without overt rashes, tattoos and scars  HEAD and Neck: without JVD, Head and scalp: normal Eyes:No scleral icterus. Fundi normal, eye movements normal. Ears: Auricle normal, canal normal, Tympanic membranes normal, insufflation normal. Nose: normal Throat: normal Neck & thyroid: normal  CHEST & LUNGS: Chest wall: normal Lungs: Clear  CVS: Reveals the PMI to be normally located. Regular rhythm, First and Second Heart sounds are normal,  absence of murmurs, rubs or gallops. Peripheral  vasculature: Radial pulses: normal Dorsal pedis pulses: normal Posterior pulses: normal  ABDOMEN:  Appearance: central obese Benign, no organomegaly, no masses, no Abdominal Aortic enlargement. No Guarding , no rebound. No Bruits. Bowel sounds: normal  RECTAL: N/A GU: N/A  EXTREMETIES: 3+edema.  MUSCULOSKELETAL:  Spine: normal Joints: intact  NEUROLOGIC: oriented to time,place and person; nonfocal. Cranial Nerves are normal.  ASSESSMENT: HTN (hypertension) - Plan: amLODipine (NORVASC) 5 MG tablet, terazosin (HYTRIN) 1 MG capsule, BMP8+EGFR, lisinopril-hydrochlorothiazide (PRINZIDE,ZESTORETIC) 20-12.5 MG per tablet  Pedal edema - Plan: BMP8+EGFR, Brain natriuretic peptide  Dyspnea - Plan: Brain natriuretic peptide  I suspect the dyspnea is due to deconditioning. No clinical evidence of CHF.But will check. Suspect that the cause is the amlodipine.  PLAN: Orders Placed This Encounter  Procedures  . BMP8+EGFR  . Brain natriuretic peptide    Medications Discontinued During This Encounter  Medication Reason  . amLODipine (NORVASC) 5 MG tablet Reorder  . lisinopril-hydrochlorothiazide (PRINZIDE,ZESTORETIC) 20-12.5 MG per tablet Reorder    Meds ordered this encounter  Medications  . amLODipine (NORVASC) 5 MG tablet    Sig: Take 1 tablet (5 mg total) by mouth daily.    Dispense:  30 tablet    Refill:  5  . terazosin (HYTRIN) 1 MG capsule    Sig: Take 1 capsule (1 mg total) by mouth at bedtime.    Dispense:  30 capsule    Refill:  3  . lisinopril-hydrochlorothiazide (PRINZIDE,ZESTORETIC) 20-12.5 MG per tablet    Sig: Take 2 tablets by mouth daily.    Dispense:  60 tablet    Refill:  11  . chlorthalidone (HYGROTON) 25 MG tablet    Sig: Take 25 mg by mouth daily.   Discussed with patient.  Weight reduction. Elevate legs  Return in about 2 weeks (around 12/26/2012) for Recheck medical problems, recheck BP.  Khallid Pasillas P. Modesto Charon, M.D.

## 2012-12-12 NOTE — Progress Notes (Signed)
Per patient she is taking only 10mg  of bystolic because she has been afraid she will run out and can not afford it.  I gave her 6 weeks of Bystolic 10mg  samples today.

## 2012-12-13 ENCOUNTER — Other Ambulatory Visit: Payer: Self-pay | Admitting: Family Medicine

## 2012-12-13 ENCOUNTER — Other Ambulatory Visit: Payer: Self-pay | Admitting: *Deleted

## 2012-12-13 LAB — BMP8+EGFR
BUN/Creatinine Ratio: 23 (ref 11–26)
BUN: 26 mg/dL (ref 8–27)
CO2: 29 mmol/L (ref 18–29)
Calcium: 9.9 mg/dL (ref 8.6–10.2)
Chloride: 97 mmol/L (ref 97–108)
Creatinine, Ser: 1.14 mg/dL — ABNORMAL HIGH (ref 0.57–1.00)
GFR calc Af Amer: 54 mL/min/{1.73_m2} — ABNORMAL LOW (ref >59)
GFR calc non Af Amer: 46 mL/min/{1.73_m2} — ABNORMAL LOW (ref >59)
Glucose: 134 mg/dL — ABNORMAL HIGH (ref 65–99)
Potassium: 4.1 mmol/L (ref 3.5–5.2)
Sodium: 142 mmol/L (ref 134–144)

## 2012-12-13 LAB — BRAIN NATRIURETIC PEPTIDE: BNP: 105.8 pg/mL — ABNORMAL HIGH (ref 0.0–100.0)

## 2012-12-13 MED ORDER — GLIMEPIRIDE 4 MG PO TABS: 4.0000 mg | ORAL_TABLET | Freq: Every day | ORAL | Status: DC

## 2012-12-13 NOTE — Telephone Encounter (Signed)
Prescription renewed in EPIC. 

## 2012-12-13 NOTE — Telephone Encounter (Signed)
Needs to get this from her cardiologist!!!

## 2012-12-13 NOTE — Telephone Encounter (Signed)
LAST AIC 7/14. WAS 5.5

## 2012-12-15 ENCOUNTER — Encounter: Payer: Self-pay | Admitting: Family Medicine

## 2012-12-15 ENCOUNTER — Ambulatory Visit (INDEPENDENT_AMBULATORY_CARE_PROVIDER_SITE_OTHER): Payer: Medicare Other | Admitting: Family Medicine

## 2012-12-15 ENCOUNTER — Ambulatory Visit (INDEPENDENT_AMBULATORY_CARE_PROVIDER_SITE_OTHER): Payer: Medicare Other

## 2012-12-15 VITALS — BP 179/73 | HR 56 | Temp 97.6°F | Wt 174.4 lb

## 2012-12-15 DIAGNOSIS — E785 Hyperlipidemia, unspecified: Secondary | ICD-10-CM

## 2012-12-15 DIAGNOSIS — R0609 Other forms of dyspnea: Secondary | ICD-10-CM

## 2012-12-15 DIAGNOSIS — R0600 Dyspnea, unspecified: Secondary | ICD-10-CM

## 2012-12-15 DIAGNOSIS — E119 Type 2 diabetes mellitus without complications: Secondary | ICD-10-CM

## 2012-12-15 DIAGNOSIS — M109 Gout, unspecified: Secondary | ICD-10-CM

## 2012-12-15 DIAGNOSIS — I4891 Unspecified atrial fibrillation: Secondary | ICD-10-CM

## 2012-12-15 DIAGNOSIS — I10 Essential (primary) hypertension: Secondary | ICD-10-CM

## 2012-12-15 DIAGNOSIS — R600 Localized edema: Secondary | ICD-10-CM

## 2012-12-15 DIAGNOSIS — R609 Edema, unspecified: Secondary | ICD-10-CM

## 2012-12-15 NOTE — Progress Notes (Signed)
Patient ID: Samantha Compton, female   DOB: 09/09/1933, 79 y.o.   MRN: [id number] SUBJECTIVE: CC: Chief Complaint  Patient presents with  . Acute Visit    edema    HPI: Patient thinks the hytrin is not working. Came to get a water pill as per the pharmacist that she goes to for the medication refill. She was not taking the lisinopril -HCTZ twice a day. As a matter of fact she stopped it because the AVS says the 1 tab a day was d/c. She is only on the hytrin and reduced dose of amlodipine. No chest pain, no dyspnea.   Past Medical History  Diagnosis Date  . Dysrhythmia     AF  . Hypertension   . Diabetes mellitus   . Hyperlipemia   . Fibromyalgia   . Arthritis   . Hypothyroidism   . Chronic pain   . Rheumatic fever     hx  . Anxiety   . GERD (gastroesophageal reflux disease)     no meds now  . [id number](478.2)    Past Surgical History  Procedure Laterality Date  . Cholecystectomy    . Appendectomy    . Carpal tunnel release  2011    right and left 2011  . Ulnar nerve transposition  12/01/2011    Procedure: ULNAR NERVE DECOMPRESSION/TRANSPOSITION;  Surgeon: Nicki Reaper, MD;  Location: [facility];  Service: Orthopedics;  Laterality: Left;  decompression possible transposition left ulnar nerve   History   Social History  . Marital Status: Married    Spouse Name: N/A    Number of Children: N/A  . Years of Education: N/A   Occupational History  . Not on file.   Social History Main Topics  . Smoking status: Former Smoker    Types: Cigarettes    Quit date: 07/06/1992  . Smokeless tobacco: Not on file  . Alcohol Use: No  . Drug Use: No  . Sexual Activity: Not on file   Other Topics Concern  . Not on file   Social History Narrative  . No narrative on file   No family history on file. Current Outpatient Prescriptions on File Prior to Visit  Medication Sig Dispense Refill  . amLODipine (NORVASC) 5 MG tablet Take 1 tablet (5 mg total) by mouth daily.   30 tablet  5  . chlorthalidone (HYGROTON) 25 MG tablet Take 25 mg by mouth daily.      . cholecalciferol (VITAMIN D) 1000 UNITS tablet Take 1,000 Units by mouth daily.      . flecainide (TAMBOCOR) 100 MG tablet Take 100 mg by mouth 2 (two) times daily.      Marland Kitchen glimepiride (AMARYL) 4 MG tablet Take 1 tablet (4 mg total) by mouth daily before breakfast.  90 tablet  0  . glimepiride (AMARYL) 4 MG tablet TAKE ONE TABLET BY MOUTH EVERY DAY  90 tablet  0  . levothyroxine (SYNTHROID, LEVOTHROID) 50 MCG tablet Take 50 mcg by mouth daily.      Marland Kitchen lisinopril-hydrochlorothiazide (PRINZIDE,ZESTORETIC) 20-12.5 MG per tablet Take 2 tablets by mouth daily.  60 tablet  11  . metFORMIN (GLUCOPHAGE) 500 MG tablet Take 1 tablet (500 mg total) by mouth 2 (two) times daily with a meal.  60 tablet  3  . nebivolol 20 MG TABS Take 1 tablet (20 mg total) by mouth daily.  30 tablet  3  . potassium chloride SA (K-DUR,KLOR-CON) 20 MEQ tablet Take 1 tablet (20 mEq total) by  mouth daily.  30 tablet  1  . pravastatin (PRAVACHOL) 40 MG tablet Take 1 tablet (40 mg total) by mouth daily.  30 tablet  5  . pyridOXINE (VITAMIN B-6) 25 MG tablet Take 25 mg by mouth daily.      Marland Kitchen terazosin (HYTRIN) 1 MG capsule Take 1 capsule (1 mg total) by mouth at bedtime.  30 capsule  3  . ULORIC 40 MG tablet TAKE 1 TAB DAILY  30 tablet  4  . warfarin (COUMADIN) 4 MG tablet Take 4 mg by mouth daily. Takes 4mg  3 days-1/2 the other 4       No current facility-administered medications on file prior to visit.   Allergies  Allergen Reactions  . Penicillins Rash    There is no immunization history on file for this patient. Prior to Admission medications   Medication Sig Start Date End Date Taking? Authorizing Provider  amLODipine (NORVASC) 5 MG tablet Take 1 tablet (5 mg total) by mouth daily. 12/12/12  Yes Ileana Ladd, MD  chlorthalidone (HYGROTON) 25 MG tablet Take 25 mg by mouth daily.   Yes Historical Provider, MD  cholecalciferol (VITAMIN  D) 1000 UNITS tablet Take 1,000 Units by mouth daily.   Yes Historical Provider, MD  flecainide (TAMBOCOR) 100 MG tablet Take 100 mg by mouth 2 (two) times daily.   Yes Historical Provider, MD  glimepiride (AMARYL) 4 MG tablet Take 1 tablet (4 mg total) by mouth daily before breakfast. 12/13/12  Yes Ileana Ladd, MD  glimepiride (AMARYL) 4 MG tablet TAKE ONE TABLET BY MOUTH EVERY DAY 12/13/12  Yes Ernestina Penna, MD  levothyroxine (SYNTHROID, LEVOTHROID) 50 MCG tablet Take 50 mcg by mouth daily.   Yes Historical Provider, MD  lisinopril-hydrochlorothiazide (PRINZIDE,ZESTORETIC) 20-12.5 MG per tablet Take 2 tablets by mouth daily. 12/12/12  Yes Ileana Ladd, MD  metFORMIN (GLUCOPHAGE) 500 MG tablet Take 1 tablet (500 mg total) by mouth 2 (two) times daily with a meal. 08/10/12  Yes Ileana Ladd, MD  nebivolol 20 MG TABS Take 1 tablet (20 mg total) by mouth daily. 08/10/12  Yes Ileana Ladd, MD  potassium chloride SA (K-DUR,KLOR-CON) 20 MEQ tablet Take 1 tablet (20 mEq total) by mouth daily. 10/09/12  Yes Ileana Ladd, MD  pravastatin (PRAVACHOL) 40 MG tablet Take 1 tablet (40 mg total) by mouth daily. 11/02/12  Yes Ileana Ladd, MD  pyridOXINE (VITAMIN B-6) 25 MG tablet Take 25 mg by mouth daily.   Yes Historical Provider, MD  terazosin (HYTRIN) 1 MG capsule Take 1 capsule (1 mg total) by mouth at bedtime. 12/12/12  Yes Ileana Ladd, MD  ULORIC 40 MG tablet TAKE 1 TAB DAILY 09/19/12  Yes Ileana Ladd, MD  warfarin (COUMADIN) 4 MG tablet Take 4 mg by mouth daily. Takes 4mg  3 days-1/2 the other 4   Yes Historical Provider, MD     ROS: As above in the HPI. All other systems are stable or negative.  OBJECTIVE: APPEARANCE:  Patient in no acute distress.The patient appeared well nourished and normally developed. Acyanotic. Waist: VITAL SIGNS:BP 179/73  Pulse 56  Temp(Src) 97.6 F (36.4 C) (Oral)  Wt 174 lb 6.4 oz (79.107 kg)  BMI 29.92 kg/m2  SpO2 95%  Elderly WF  SKIN: warm and   Dry without overt rashes, tattoos and scars  HEAD and Neck: without JVD, Head and scalp: normal Eyes:No scleral icterus. Fundi normal, eye movements normal. Ears: Auricle normal, canal normal,  Tympanic membranes normal, insufflation normal. Nose: normal Throat: normal Neck & thyroid: normal  CHEST & LUNGS: Chest wall: normal Lungs: Clear  CVS: Reveals the PMI to be normally located. Regular rhythm, First and Second Heart sounds are normal,  absence of murmurs, rubs or gallops. Peripheral vasculature: Radial pulses: normal Dorsal pedis pulses: normal Posterior pulses: normal  ABDOMEN:  Appearance: normal Benign, no organomegaly, no masses, no Abdominal Aortic enlargement. No Guarding , no rebound. No Bruits. Bowel sounds: normal  RECTAL: N/A GU: N/A  EXTREMETIES: 4+ edematous.  MUSCULOSKELETAL:  Spine: normal Joints: intact  NEUROLOGIC: oriented to time,place and person; nonfocal.  Results for orders placed in visit on 12/12/12  BMP8+EGFR      Result Value Range   Glucose 134 (*) 65 - 99 mg/dL   BUN 26  8 - 27 mg/dL   Creatinine, Ser 0.86 (*) 0.57 - 1.00 mg/dL   GFR calc non Af Amer 46 (*) >59 mL/min/1.73   GFR calc Af Amer 54 (*) >59 mL/min/1.73   BUN/Creatinine Ratio 23  11 - 26   Sodium 142  134 - 144 mmol/L   Potassium 4.1  3.5 - 5.2 mmol/L   Chloride 97  97 - 108 mmol/L   CO2 29  18 - 29 mmol/L   Calcium 9.9  8.6 - 10.2 mg/dL  BRAIN NATRIURETIC PEPTIDE      Result Value Range   BNP 105.8 (*) 0.0 - 100.0 pg/mL    ASSESSMENT: Dyspnea - Plan: DG Chest 2 View  Pedal edema  DM (diabetes mellitus)  HLD (hyperlipidemia)  HTN (hypertension)  Afib  Gout    PLAN: [facility] reading (PRIMARY) by  Dr.Daeshaun Specht: cardiomegaly, chronic changes. Atelectasis and scarring.                               Orders Placed This Encounter  Procedures  . DG Chest 2 View    Standing Status: Future     Number of Occurrences: 1     Standing Expiration Date: 02/14/2014     Order Specific Question:  Reason for Exam (SYMPTOM  OR DIAGNOSIS REQUIRED)    Answer:  dyspnea    Order Specific Question:  Preferred imaging location?    Answer:  [facility]    reviewed her meds with her that she needs to be on. She has misinterpreted the AVSD.  BP check on Monday and patient to bring all her medication.  Sameera Betton P. [place] Charon, M.D.

## 2012-12-18 ENCOUNTER — Ambulatory Visit: Payer: Medicare Other

## 2012-12-28 ENCOUNTER — Encounter: Payer: Self-pay | Admitting: Family Medicine

## 2012-12-28 ENCOUNTER — Ambulatory Visit (INDEPENDENT_AMBULATORY_CARE_PROVIDER_SITE_OTHER): Payer: Medicare Other | Admitting: Family Medicine

## 2012-12-28 VITALS — BP 172/78 | HR 52 | Temp 97.2°F | Wt 179.0 lb

## 2012-12-28 DIAGNOSIS — R609 Edema, unspecified: Secondary | ICD-10-CM

## 2012-12-28 DIAGNOSIS — R600 Localized edema: Secondary | ICD-10-CM

## 2012-12-28 DIAGNOSIS — I4891 Unspecified atrial fibrillation: Secondary | ICD-10-CM

## 2012-12-28 DIAGNOSIS — Z23 Encounter for immunization: Secondary | ICD-10-CM

## 2012-12-28 DIAGNOSIS — E785 Hyperlipidemia, unspecified: Secondary | ICD-10-CM

## 2012-12-28 DIAGNOSIS — I10 Essential (primary) hypertension: Secondary | ICD-10-CM

## 2012-12-28 DIAGNOSIS — E119 Type 2 diabetes mellitus without complications: Secondary | ICD-10-CM

## 2012-12-28 DIAGNOSIS — R0609 Other forms of dyspnea: Secondary | ICD-10-CM

## 2012-12-28 DIAGNOSIS — R0600 Dyspnea, unspecified: Secondary | ICD-10-CM

## 2012-12-28 DIAGNOSIS — M109 Gout, unspecified: Secondary | ICD-10-CM

## 2012-12-28 MED ORDER — TERAZOSIN HCL 2 MG PO CAPS: 2.0000 mg | ORAL_CAPSULE | Freq: Every day | ORAL | Status: DC

## 2012-12-28 MED ORDER — TERAZOSIN HCL 1 MG PO CAPS: 2.0000 mg | ORAL_CAPSULE | Freq: Every day | ORAL | Status: DC

## 2012-12-28 NOTE — Progress Notes (Signed)
Patient ID: Samantha Compton, female   DOB: 1934/01/12, 79 y.o.   MRN: [id number] SUBJECTIVE: CC: Chief Complaint  Patient presents with  . Follow-up    2 week follow up  c/o swelling in feet    HPI: Here for follow up on BP and  Pedal edema. No SOB no orthopnea.  Legs continue to stay swollen. Was worse at bedtime and better this am. But [place] swelled again. Reluctant to wear compression stockings.  Patient is here for follow up of hypertension: denies Headache;deniesChest Pain;denies weakness;denies Shortness of Breath or Orthopnea;denies Visual changes;denies palpitations;denies cough;denies pedal edema;denies symptoms of TIA or stroke; admits to Compliance with medications. denies Problems with medications.  Past Medical History  Diagnosis Date  . Dysrhythmia     AF  . Hypertension   . Diabetes mellitus   . Hyperlipemia   . Fibromyalgia   . Arthritis   . Hypothyroidism   . Chronic pain   . Rheumatic fever     hx  . Anxiety   . GERD (gastroesophageal reflux disease)     no meds now  . MVHQIONG(295.2)    Past Surgical History  Procedure Laterality Date  . Cholecystectomy    . Appendectomy    . Carpal tunnel release  2011    right and left 2011  . Ulnar nerve transposition  12/01/2011    Procedure: ULNAR NERVE DECOMPRESSION/TRANSPOSITION;  Surgeon: Nicki Reaper, MD;  Location: [facility];  Service: Orthopedics;  Laterality: Left;  decompression possible transposition left ulnar nerve   History   Social History  . Marital Status: Married    Spouse Name: N/A    Number of Children: N/A  . Years of Education: N/A   Occupational History  . Not on file.   Social History Main Topics  . Smoking status: Former Smoker    Types: Cigarettes    Quit date: 07/06/1992  . Smokeless tobacco: Not on file  . Alcohol Use: No  . Drug Use: No  . Sexual Activity: Not on file   Other Topics Concern  . Not on file   Social History Narrative  . No narrative on file    No family history on file. Current Outpatient Prescriptions on File Prior to Visit  Medication Sig Dispense Refill  . amLODipine (NORVASC) 5 MG tablet Take 1 tablet (5 mg total) by mouth daily.  30 tablet  5  . cholecalciferol (VITAMIN D) 1000 UNITS tablet Take 1,000 Units by mouth daily.      . flecainide (TAMBOCOR) 100 MG tablet Take 100 mg by mouth 2 (two) times daily.      Marland Kitchen glimepiride (AMARYL) 4 MG tablet Take 1 tablet (4 mg total) by mouth daily before breakfast.  90 tablet  0  . levothyroxine (SYNTHROID, LEVOTHROID) 50 MCG tablet Take 50 mcg by mouth daily.      Marland Kitchen lisinopril-hydrochlorothiazide (PRINZIDE,ZESTORETIC) 20-12.5 MG per tablet Take 2 tablets by mouth daily.  60 tablet  11  . metFORMIN (GLUCOPHAGE) 500 MG tablet Take 1 tablet (500 mg total) by mouth 2 (two) times daily with a meal.  60 tablet  3  . nebivolol 20 MG TABS Take 1 tablet (20 mg total) by mouth daily.  30 tablet  3  . potassium chloride SA (K-DUR,KLOR-CON) 20 MEQ tablet Take 1 tablet (20 mEq total) by mouth daily.  30 tablet  1  . pravastatin (PRAVACHOL) 40 MG tablet Take 1 tablet (40 mg total) by mouth daily.  30  tablet  5  . pyridOXINE (VITAMIN B-6) 25 MG tablet Take 25 mg by mouth daily.      Marland Kitchen ULORIC 40 MG tablet TAKE 1 TAB DAILY  30 tablet  4  . warfarin (COUMADIN) 4 MG tablet Take 4 mg by mouth daily. Takes 4mg  3 days-1/2 the other 4       No current facility-administered medications on file prior to visit.   Allergies  Allergen Reactions  . Penicillins Rash   Immunization History  Administered Date(s) Administered  . Influenza,inj,Quad PF,36+ Mos 12/28/2012   Prior to Admission medications   Medication Sig Start Date End Date Taking? Authorizing Provider  amLODipine (NORVASC) 5 MG tablet Take 1 tablet (5 mg total) by mouth daily. 12/12/12  Yes Ileana Ladd, MD  cholecalciferol (VITAMIN D) 1000 UNITS tablet Take 1,000 Units by mouth daily.   Yes Historical Provider, MD  flecainide (TAMBOCOR) 100  MG tablet Take 100 mg by mouth 2 (two) times daily.    Historical Provider, MD  glimepiride (AMARYL) 4 MG tablet Take 1 tablet (4 mg total) by mouth daily before breakfast. 12/13/12   Ileana Ladd, MD  levothyroxine (SYNTHROID, LEVOTHROID) 50 MCG tablet Take 50 mcg by mouth daily.    Historical Provider, MD  lisinopril-hydrochlorothiazide (PRINZIDE,ZESTORETIC) 20-12.5 MG per tablet Take 2 tablets by mouth daily. 12/12/12   Ileana Ladd, MD  metFORMIN (GLUCOPHAGE) 500 MG tablet Take 1 tablet (500 mg total) by mouth 2 (two) times daily with a meal. 08/10/12   Ileana Ladd, MD  nebivolol 20 MG TABS Take 1 tablet (20 mg total) by mouth daily. 08/10/12   Ileana Ladd, MD  potassium chloride SA (K-DUR,KLOR-CON) 20 MEQ tablet Take 1 tablet (20 mEq total) by mouth daily. 10/09/12   Ileana Ladd, MD  pravastatin (PRAVACHOL) 40 MG tablet Take 1 tablet (40 mg total) by mouth daily. 11/02/12   Ileana Ladd, MD  pyridOXINE (VITAMIN B-6) 25 MG tablet Take 25 mg by mouth daily.    Historical Provider, MD  terazosin (HYTRIN) 1 MG capsule Take 2 capsules (2 mg total) by mouth at bedtime. 12/28/12   Ileana Ladd, MD  ULORIC 40 MG tablet TAKE 1 TAB DAILY 09/19/12   Ileana Ladd, MD  warfarin (COUMADIN) 4 MG tablet Take 4 mg by mouth daily. Takes 4mg  3 days-1/2 the other 4    Historical Provider, MD     ROS: As above in the HPI. All other systems are stable or negative.  OBJECTIVE: APPEARANCE:  Patient in no acute distress.The patient appeared well nourished and normally developed. Acyanotic. Waist: VITAL SIGNS:BP 172/78  Pulse 52  Temp(Src) 97.2 F (36.2 C) (Oral)  Wt 179 lb (81.194 kg)  BMI 30.71 kg/m2 WF obese  SKIN: warm and  Dry without overt rashes, tattoos and scars  HEAD and Neck: without JVD, Head and scalp: normal Eyes:No scleral icterus. Fundi normal, eye movements normal. Ears: Auricle normal, canal normal, Tympanic membranes normal, insufflation normal. Nose: normal Throat:  normal Neck & thyroid: normal  CHEST & LUNGS: Chest wall: normal Lungs: Clear  CVS: Reveals the PMI to be normally located. Regular rhythm, First and Second Heart sounds are normal,  absence of murmurs, rubs or gallops. Peripheral vasculature: Radial pulses: normal Dorsal pedis pulses: normal Posterior pulses: normal  ABDOMEN:  Appearance: normal Benign, no organomegaly, no masses, no Abdominal Aortic enlargement. No Guarding , no rebound. No Bruits. Bowel sounds: normal  RECTAL: N/A GU: N/A  EXTREMETIES: 4+ edematous Legs.   NEUROLOGIC: oriented to time,place and person; nonfocal. Results for orders placed in visit on 12/12/12  BMP8+EGFR      Result Value Range   Glucose 134 (*) 65 - 99 mg/dL   BUN 26  8 - 27 mg/dL   Creatinine, Ser 0.27 (*) 0.57 - 1.00 mg/dL   GFR calc non Af Amer 46 (*) >59 mL/min/1.73   GFR calc Af Amer 54 (*) >59 mL/min/1.73   BUN/Creatinine Ratio 23  11 - 26   Sodium 142  134 - 144 mmol/L   Potassium 4.1  3.5 - 5.2 mmol/L   Chloride 97  97 - 108 mmol/L   CO2 29  18 - 29 mmol/L   Calcium 9.9  8.6 - 10.2 mg/dL  BRAIN NATRIURETIC PEPTIDE      Result Value Range   BNP 105.8 (*) 0.0 - 100.0 pg/mL    ASSESSMENT: Need for prophylactic vaccination and inoculation against influenza  DM (diabetes mellitus)  HLD (hyperlipidemia)  HTN (hypertension) - Plan: terazosin (HYTRIN) 1 MG capsule, [place] Art/Ven Flow Abd Pelv Doppler, US Venous Img Lower Bilateral  Afib  Gout  Dyspnea  Pedal edema - Plan: [place] Art/Ven Flow Abd Pelv Doppler, US Venous Img Lower Bilateral  PLAN: Orders Placed This Encounter  Procedures  . [place] Art/Ven Flow Abd Pelv Doppler    Standing Status: Future     Number of Occurrences:      Standing Expiration Date: 02/27/2014    Order Specific Question:  Reason for Exam (SYMPTOM  OR DIAGNOSIS REQUIRED)    Answer:  severe bilateral pedal edema    Order Specific Question:  Preferred imaging location?    Answer:  [facility]  . US Venous Img Lower Bilateral    Standing Status: Future     Number of Occurrences:      Standing Expiration Date: 02/27/2014    Order Specific Question:  Reason for Exam (SYMPTOM  OR DIAGNOSIS REQUIRED)    Answer:  severe bilateral pedal edema    Order Specific Question:  Preferred imaging location?    Answer:  [facility]     Medications Discontinued During This Encounter  Medication Reason  . glimepiride (AMARYL) 4 MG tablet Duplicate  . chlorthalidone (HYGROTON) 25 MG tablet Completed Course  . terazosin (HYTRIN) 1 MG capsule Reorder  . terazosin (HYTRIN) 1 MG capsule Reorder    Meds ordered this encounter  Medications  . DISCONTD: terazosin (HYTRIN) 1 MG capsule    Sig: Take 2 capsules (2 mg total) by mouth at bedtime.    Dispense:  30 capsule    Refill:  3  . terazosin (HYTRIN) 2 MG capsule    Sig: Take 1 capsule (2 mg total) by mouth at bedtime.    Dispense:  30 capsule    Refill:  3    Elevate legs Await doppler studies.  Return in about 2 weeks (around 01/11/2013) for recheck BP and pedal edema.  Jorge Retz P. [place] Charon, M.D.

## 2013-01-02 ENCOUNTER — Ambulatory Visit: Payer: Medicare Other | Admitting: Family Medicine

## 2013-01-03 ENCOUNTER — Other Ambulatory Visit: Payer: Self-pay

## 2013-01-03 DIAGNOSIS — I10 Essential (primary) hypertension: Secondary | ICD-10-CM

## 2013-01-04 ENCOUNTER — Other Ambulatory Visit: Payer: Self-pay | Admitting: Family Medicine

## 2013-01-05 ENCOUNTER — Other Ambulatory Visit (HOSPITAL_COMMUNITY): Payer: PRIVATE HEALTH INSURANCE

## 2013-01-05 NOTE — Telephone Encounter (Signed)
Prescription renewed in EPIC. 

## 2013-01-08 NOTE — Telephone Encounter (Signed)
rx called to [facility] and left on voice mail

## 2013-01-11 ENCOUNTER — Ambulatory Visit (HOSPITAL_COMMUNITY)
Admission: RE | Admit: 2013-01-11 | Discharge: 2013-01-11 | Disposition: A | Discharge status: Home / Self Care | Payer: Medicare Other | Source: Ambulatory Visit | Attending: Family Medicine | Admitting: Family Medicine

## 2013-01-11 ENCOUNTER — Ambulatory Visit: Payer: Medicare Other | Admitting: Family Medicine

## 2013-01-11 DIAGNOSIS — I10 Essential (primary) hypertension: Secondary | ICD-10-CM

## 2013-01-12 ENCOUNTER — Ambulatory Visit (INDEPENDENT_AMBULATORY_CARE_PROVIDER_SITE_OTHER): Payer: Medicare Other | Admitting: Family Medicine

## 2013-01-12 ENCOUNTER — Encounter: Payer: Self-pay | Admitting: Family Medicine

## 2013-01-12 VITALS — BP 153/75 | HR 55 | Temp 97.2°F | Ht 65.0 in | Wt 179.4 lb

## 2013-01-12 DIAGNOSIS — R0609 Other forms of dyspnea: Secondary | ICD-10-CM

## 2013-01-12 DIAGNOSIS — M791 Myalgia, unspecified site: Secondary | ICD-10-CM

## 2013-01-12 DIAGNOSIS — IMO0001 Reserved for inherently not codable concepts without codable children: Secondary | ICD-10-CM

## 2013-01-12 DIAGNOSIS — E785 Hyperlipidemia, unspecified: Secondary | ICD-10-CM

## 2013-01-12 DIAGNOSIS — I4891 Unspecified atrial fibrillation: Secondary | ICD-10-CM

## 2013-01-12 DIAGNOSIS — E119 Type 2 diabetes mellitus without complications: Secondary | ICD-10-CM

## 2013-01-12 DIAGNOSIS — M109 Gout, unspecified: Secondary | ICD-10-CM

## 2013-01-12 DIAGNOSIS — R609 Edema, unspecified: Secondary | ICD-10-CM

## 2013-01-12 DIAGNOSIS — R0600 Dyspnea, unspecified: Secondary | ICD-10-CM

## 2013-01-12 DIAGNOSIS — I10 Essential (primary) hypertension: Secondary | ICD-10-CM

## 2013-01-12 DIAGNOSIS — R600 Localized edema: Secondary | ICD-10-CM

## 2013-01-12 MED ORDER — TERAZOSIN HCL 2 MG PO CAPS: 4.0000 mg | ORAL_CAPSULE | Freq: Every day | ORAL | Status: DC

## 2013-01-12 NOTE — Progress Notes (Signed)
Patient ID: Samantha Compton, female   DOB: [birth year], 79 y.o.   MRN: [id number] SUBJECTIVE: CC: Chief Complaint  Patient presents with  . Follow-up    2 wk follow up  feet swelling   discuss ultrasound    HPI: Here for follow up oh her pedal edema and HTN. Medications adjusted. Scans were ordered and apparently the wrong test was done. The [place] was done on the kidneys and not on the lower extremity dopplers.  She has an appointment soon with her Cardiologist  Past Medical History  Diagnosis Date  . Dysrhythmia     AF  . Hypertension   . Diabetes mellitus   . Hyperlipemia   . Fibromyalgia   . Arthritis   . Hypothyroidism   . Chronic pain   . Rheumatic fever     hx  . Anxiety   . GERD (gastroesophageal reflux disease)     no meds now  . MWNUUVOZ(366.4)    Past Surgical History  Procedure Laterality Date  . Cholecystectomy    . Appendectomy    . Carpal tunnel release  2011    right and left 2011  . Ulnar nerve transposition  12/01/2011    Procedure: ULNAR NERVE DECOMPRESSION/TRANSPOSITION;  Surgeon: Nicki Reaper, MD;  Location: [facility];  Service: Orthopedics;  Laterality: Left;  decompression possible transposition left ulnar nerve   History   Social History  . Marital Status: Married    Spouse Name: N/A    Number of Children: N/A  . Years of Education: N/A   Occupational History  . Not on file.   Social History Main Topics  . Smoking status: Former Smoker    Types: Cigarettes    Quit date: 07/06/1992  . Smokeless tobacco: Not on file  . Alcohol Use: No  . Drug Use: No  . Sexual Activity: Not on file   Other Topics Concern  . Not on file   Social History Narrative  . No narrative on file   No family history on file. Current Outpatient Prescriptions on File Prior to Visit  Medication Sig Dispense Refill  . cholecalciferol (VITAMIN D) 1000 UNITS tablet Take 1,000 Units by mouth daily.      . flecainide (TAMBOCOR) 100 MG tablet Take 100 mg by  mouth 2 (two) times daily.      Marland Kitchen glimepiride (AMARYL) 4 MG tablet Take 1 tablet (4 mg total) by mouth daily before breakfast.  90 tablet  0  . levothyroxine (SYNTHROID, LEVOTHROID) 50 MCG tablet TAKE 1 TABLET BY MOUTH IN THE MORNING  30 tablet  10  . lisinopril-hydrochlorothiazide (PRINZIDE,ZESTORETIC) 20-12.5 MG per tablet Take 2 tablets by mouth daily.  60 tablet  11  . metFORMIN (GLUCOPHAGE) 500 MG tablet Take 1 tablet (500 mg total) by mouth 2 (two) times daily with a meal.  60 tablet  3  . nebivolol 20 MG TABS Take 1 tablet (20 mg total) by mouth daily.  30 tablet  3  . potassium chloride SA (K-DUR,KLOR-CON) 20 MEQ tablet Take one po qd  30 tablet  1  . pravastatin (PRAVACHOL) 40 MG tablet Take 1 tablet (40 mg total) by mouth daily.  30 tablet  5  . pyridOXINE (VITAMIN B-6) 25 MG tablet Take 25 mg by mouth daily.      Marland Kitchen ULORIC 40 MG tablet TAKE 1 TAB DAILY  30 tablet  4  . warfarin (COUMADIN) 4 MG tablet Take 4 mg by mouth daily. Takes 4mg   3 days-1/2 the other 4       No current facility-administered medications on file prior to visit.   Allergies  Allergen Reactions  . Penicillins Rash   Immunization History  Administered Date(s) Administered  . Influenza,inj,Quad PF,36+ Mos 12/28/2012   Prior to Admission medications   Medication Sig Start Date End Date Taking? Authorizing Provider  amLODipine (NORVASC) 5 MG tablet Take 0.5 tablets (2.5 mg total) by mouth daily. 01/12/13   Ileana Ladd, MD  cholecalciferol (VITAMIN D) 1000 UNITS tablet Take 1,000 Units by mouth daily.    Historical Provider, MD  flecainide (TAMBOCOR) 100 MG tablet Take 100 mg by mouth 2 (two) times daily.    Historical Provider, MD  glimepiride (AMARYL) 4 MG tablet Take 1 tablet (4 mg total) by mouth daily before breakfast. 12/13/12   Ileana Ladd, MD  levothyroxine (SYNTHROID, LEVOTHROID) 50 MCG tablet TAKE 1 TABLET BY MOUTH IN THE MORNING 01/04/13   Ileana Ladd, MD  lisinopril-hydrochlorothiazide  (PRINZIDE,ZESTORETIC) 20-12.5 MG per tablet Take 2 tablets by mouth daily. 12/12/12   Ileana Ladd, MD  metFORMIN (GLUCOPHAGE) 500 MG tablet Take 1 tablet (500 mg total) by mouth 2 (two) times daily with a meal. 08/10/12   Ileana Ladd, MD  nebivolol 20 MG TABS Take 1 tablet (20 mg total) by mouth daily. 08/10/12   Ileana Ladd, MD  potassium chloride SA (K-DUR,KLOR-CON) 20 MEQ tablet Take one po qd 01/04/13   Ileana Ladd, MD  pravastatin (PRAVACHOL) 40 MG tablet Take 1 tablet (40 mg total) by mouth daily. 11/02/12   Ileana Ladd, MD  pyridOXINE (VITAMIN B-6) 25 MG tablet Take 25 mg by mouth daily.    Historical Provider, MD  terazosin (HYTRIN) 2 MG capsule Take 2 capsules (4 mg total) by mouth at bedtime. 01/12/13   Ileana Ladd, MD  ULORIC 40 MG tablet TAKE 1 TAB DAILY 09/19/12   Ileana Ladd, MD  warfarin (COUMADIN) 4 MG tablet Take 4 mg by mouth daily. Takes 4mg  3 days-1/2 the other 4    Historical Provider, MD     ROS: As above in the HPI. All other systems are stable or negative.  OBJECTIVE: APPEARANCE:  Patient in no acute distress.The patient appeared well nourished and normally developed. Acyanotic. Waist: VITAL SIGNS:BP 153/75  Pulse 55  Temp(Src) 97.2 F (36.2 C) (Oral)  Ht 5\' 5"  (1.651 m)  Wt 179 lb 6.4 oz (81.375 kg)  BMI 29.85 kg/m2  WF  SKIN: warm and  Dry without overt rashes, tattoos and scars  HEAD and Neck: without JVD, Head and scalp: normal Eyes:No scleral icterus. Fundi normal, eye movements normal. Ears: Auricle normal, canal normal, Tympanic membranes normal, insufflation normal. Nose: normal Throat: normal Neck & thyroid: normal  CHEST & LUNGS: Chest wall: normal Lungs: Clear  CVS: Reveals the PMI to be normally located. Regular rhythm, First and Second Heart sounds are normal,  absence of murmurs, rubs or gallops. Peripheral vasculature: Radial pulses: normal Dorsal pedis pulses: normal Posterior pulses: normal  ABDOMEN:   Appearance: normal Benign, no organomegaly, no masses, no Abdominal Aortic enlargement. No Guarding , no rebound. No Bruits. Bowel sounds: normal  RECTAL: N/A GU: N/A  EXTREMETIES: 3+ bilateral leg edema  NEUROLOGIC: oriented to time,place and person; nonfocal.  ASSESSMENT: Pedal edema - Plan: US Venous Img Lower Bilateral  HTN (hypertension) - Plan: terazosin (HYTRIN) 2 MG capsule, amLODipine (NORVASC) 5 MG tablet  HLD (hyperlipidemia)  DM (  diabetes mellitus)  Afib  Gout  Myalgia  Dyspnea  PLAN:  Orders Placed This Encounter  Procedures  . US Venous Img Lower Bilateral    Standing Status: Future     Number of Occurrences:      Standing Expiration Date: 03/14/2014    Order Specific Question:  Reason for Exam (SYMPTOM  OR DIAGNOSIS REQUIRED)    Answer:  Bilateral leg edema    Order Specific Question:  Preferred imaging location?    Answer:  [facility]   Meds ordered this encounter  Medications  . terazosin (HYTRIN) 2 MG capsule    Sig: Take 2 capsules (4 mg total) by mouth at bedtime.    Dispense:  60 capsule    Refill:  5  . amLODipine (NORVASC) 5 MG tablet    Sig: Take 0.5 tablets (2.5 mg total) by mouth daily.    Dispense:  30 tablet    Refill:  5   Medications Discontinued During This Encounter  Medication Reason  . terazosin (HYTRIN) 2 MG capsule Reorder  . amLODipine (NORVASC) 5 MG tablet Reorder   Return in about 2 weeks (around 01/26/2013). to recheck the BP Hopefully by then we will have gotten the correct Doppler study.  Jarae Nemmers P. Modesto Charon, M.D.

## 2013-01-25 ENCOUNTER — Encounter: Payer: Self-pay | Admitting: Family Medicine

## 2013-01-25 ENCOUNTER — Ambulatory Visit (INDEPENDENT_AMBULATORY_CARE_PROVIDER_SITE_OTHER): Payer: Medicare Other | Admitting: Family Medicine

## 2013-01-25 VITALS — BP 183/69 | HR 54 | Temp 98.1°F | Ht 65.5 in | Wt 174.2 lb

## 2013-01-25 DIAGNOSIS — E785 Hyperlipidemia, unspecified: Secondary | ICD-10-CM

## 2013-01-25 DIAGNOSIS — I4891 Unspecified atrial fibrillation: Secondary | ICD-10-CM

## 2013-01-25 DIAGNOSIS — E119 Type 2 diabetes mellitus without complications: Secondary | ICD-10-CM

## 2013-01-25 DIAGNOSIS — R600 Localized edema: Secondary | ICD-10-CM

## 2013-01-25 DIAGNOSIS — I10 Essential (primary) hypertension: Secondary | ICD-10-CM

## 2013-01-25 DIAGNOSIS — R609 Edema, unspecified: Secondary | ICD-10-CM

## 2013-01-25 MED ORDER — TERAZOSIN HCL 5 MG PO CAPS: 5.0000 mg | ORAL_CAPSULE | Freq: Every day | ORAL | Status: DC

## 2013-01-25 NOTE — Progress Notes (Signed)
Patient ID: Samantha Compton, female   DOB: 1933-07-25, 79 y.o.   MRN: [id number] SUBJECTIVE: CC: Chief Complaint  Patient presents with  . Follow-up    2 week follow up     HPI: Here to recheck the BP and pedal edema. No headache, no chest pain. No neurologic symptoms. She had stopped the norvasc and the swelling went down. Hasn't taken it. And swelling better.   Past Medical History  Diagnosis Date  . Dysrhythmia     AF  . Hypertension   . Diabetes mellitus   . Hyperlipemia   . Fibromyalgia   . Arthritis   . Hypothyroidism   . Chronic pain   . Rheumatic fever     hx  . Anxiety   . GERD (gastroesophageal reflux disease)     no meds now  . JYNWGNFA(213.0)    Past Surgical History  Procedure Laterality Date  . Cholecystectomy    . Appendectomy    . Carpal tunnel release  2011    right and left 2011  . Ulnar nerve transposition  12/01/2011    Procedure: ULNAR NERVE DECOMPRESSION/TRANSPOSITION;  Surgeon: Nicki Reaper, MD;  Location: [facility];  Service: Orthopedics;  Laterality: Left;  decompression possible transposition left ulnar nerve   History   Social History  . Marital Status: Married    Spouse Name: N/A    Number of Children: N/A  . Years of Education: N/A   Occupational History  . Not on file.   Social History Main Topics  . Smoking status: Former Smoker    Types: Cigarettes    Quit date: 07/06/1992  . Smokeless tobacco: Not on file  . Alcohol Use: No  . Drug Use: No  . Sexual Activity: Not on file   Other Topics Concern  . Not on file   Social History Narrative  . No narrative on file   No family history on file. Current Outpatient Prescriptions on File Prior to Visit  Medication Sig Dispense Refill  . cholecalciferol (VITAMIN D) 1000 UNITS tablet Take 1,000 Units by mouth daily.      . flecainide (TAMBOCOR) 100 MG tablet Take 100 mg by mouth 2 (two) times daily.      Marland Kitchen glimepiride (AMARYL) 4 MG tablet Take 1 tablet (4 mg total)  by mouth daily before breakfast.  90 tablet  0  . levothyroxine (SYNTHROID, LEVOTHROID) 50 MCG tablet TAKE 1 TABLET BY MOUTH IN THE MORNING  30 tablet  10  . lisinopril-hydrochlorothiazide (PRINZIDE,ZESTORETIC) 20-12.5 MG per tablet Take 2 tablets by mouth daily.  60 tablet  11  . metFORMIN (GLUCOPHAGE) 500 MG tablet Take 1 tablet (500 mg total) by mouth 2 (two) times daily with a meal.  60 tablet  3  . nebivolol 20 MG TABS Take 1 tablet (20 mg total) by mouth daily.  30 tablet  3  . potassium chloride SA (K-DUR,KLOR-CON) 20 MEQ tablet Take one po qd  30 tablet  1  . pravastatin (PRAVACHOL) 40 MG tablet Take 1 tablet (40 mg total) by mouth daily.  30 tablet  5  . pyridOXINE (VITAMIN B-6) 25 MG tablet Take 25 mg by mouth daily.      Marland Kitchen ULORIC 40 MG tablet TAKE 1 TAB DAILY  30 tablet  4  . warfarin (COUMADIN) 4 MG tablet Take 4 mg by mouth daily. Takes 4mg  3 days-1/2 the other 4       No current facility-administered medications on file prior  to visit.   Allergies  Allergen Reactions  . Penicillins Rash   Immunization History  Administered Date(s) Administered  . Influenza,inj,Quad PF,36+ Mos 12/28/2012   Prior to Admission medications   Medication Sig Start Date End Date Taking? Authorizing Provider  amLODipine (NORVASC) 5 MG tablet Take 0.5 tablets (2.5 mg total) by mouth daily. 01/12/13   Ileana Ladd, MD  cholecalciferol (VITAMIN D) 1000 UNITS tablet Take 1,000 Units by mouth daily.    Historical Provider, MD  flecainide (TAMBOCOR) 100 MG tablet Take 100 mg by mouth 2 (two) times daily.    Historical Provider, MD  glimepiride (AMARYL) 4 MG tablet Take 1 tablet (4 mg total) by mouth daily before breakfast. 12/13/12   Ileana Ladd, MD  levothyroxine (SYNTHROID, LEVOTHROID) 50 MCG tablet TAKE 1 TABLET BY MOUTH IN THE MORNING 01/04/13   Ileana Ladd, MD  lisinopril-hydrochlorothiazide (PRINZIDE,ZESTORETIC) 20-12.5 MG per tablet Take 2 tablets by mouth daily. 12/12/12   Ileana Ladd, MD   metFORMIN (GLUCOPHAGE) 500 MG tablet Take 1 tablet (500 mg total) by mouth 2 (two) times daily with a meal. 08/10/12   Ileana Ladd, MD  nebivolol 20 MG TABS Take 1 tablet (20 mg total) by mouth daily. 08/10/12   Ileana Ladd, MD  potassium chloride SA (K-DUR,KLOR-CON) 20 MEQ tablet Take one po qd 01/04/13   Ileana Ladd, MD  pravastatin (PRAVACHOL) 40 MG tablet Take 1 tablet (40 mg total) by mouth daily. 11/02/12   Ileana Ladd, MD  pyridOXINE (VITAMIN B-6) 25 MG tablet Take 25 mg by mouth daily.    Historical Provider, MD  terazosin (HYTRIN) 2 MG capsule Take 2 capsules (4 mg total) by mouth at bedtime. 01/12/13   Ileana Ladd, MD  ULORIC 40 MG tablet TAKE 1 TAB DAILY 09/19/12   Ileana Ladd, MD  warfarin (COUMADIN) 4 MG tablet Take 4 mg by mouth daily. Takes 4mg  3 days-1/2 the other 4    Historical Provider, MD     ROS: As above in the HPI. All other systems are stable or negative.  OBJECTIVE: APPEARANCE:  Patient in no acute distress.The patient appeared well nourished and normally developed. Acyanotic. Waist: VITAL SIGNS:BP 183/69  Pulse 54  Temp(Src) 98.1 F (36.7 C) (Oral)  Ht 5' 5.5" (1.664 m)  Wt 174 lb 3.2 oz (79.017 kg)  BMI 28.54 kg/m2   SKIN: warm and  Dry without overt rashes, tattoos and scars  HEAD and Neck: without JVD, Head and scalp: normal Eyes:No scleral icterus. Fundi normal, eye movements normal. Ears: Auricle normal, canal normal, Tympanic membranes normal, insufflation normal. Nose: normal Throat: normal Neck & thyroid: normal  CHEST & LUNGS: Chest wall: normal Lungs: Clear  CVS: Reveals the PMI to be normally located. Regular rhythm, First and Second Heart sounds are normal,  absence of murmurs, rubs or gallops. Peripheral vasculature: Radial pulses: normal Dorsal pedis pulses: normal Posterior pulses: normal  ABDOMEN:  Appearance: normal Benign, no organomegaly, no masses, no Abdominal Aortic enlargement. No Guarding , no  rebound. No Bruits. Bowel sounds: normal  RECTAL: N/A GU: N/A  EXTREMETIES: nonedematous.  MUSCULOSKELETAL:  Spine: normal Joints: intact  NEUROLOGIC: oriented to time,place and person; nonfocal. Strength is normal Sensory is normal Reflexes are normal Cranial Nerves are normal.  ASSESSMENT: HTN (hypertension) - Plan: terazosin (HYTRIN) 5 MG capsule, amLODipine (NORVASC) 5 MG tablet  Afib - Plan: POCT INR  Pedal edema  HLD (hyperlipidemia)  DM (diabetes mellitus) Patient's  pedal edema is as I suspected due to the norvasc. However with her comorbid conditions we have limits to her medication adjustment, especially with her renal functions and her age. I think she will have to tolerate some pedal edema to achieve BP control. Advised patient restart her norvasc and use compression stockings.  PLAN:  Orders Placed This Encounter  Procedures  . POCT INR   Meds ordered this encounter  Medications  . terazosin (HYTRIN) 5 MG capsule    Sig: Take 1 capsule (5 mg total) by mouth at bedtime.    Dispense:  30 capsule    Refill:  3  . amLODipine (NORVASC) 5 MG tablet    Sig: Take 1 tablet (5 mg total) by mouth daily.    Dispense:  30 tablet    Refill:  0   Medications Discontinued During This Encounter  Medication Reason  . amLODipine (NORVASC) 5 MG tablet Side effect (s)  . terazosin (HYTRIN) 2 MG capsule Reorder  Rx written for medium grade knee highs. 20-0 mm hg. #1 pair. RF x 3.    Return in about 1 week (around 02/01/2013) for recheck BP.  Kyvon Hu P. Modesto Charon, M.D.

## 2013-02-06 ENCOUNTER — Telehealth: Payer: Self-pay | Admitting: Family Medicine

## 2013-02-06 NOTE — Telephone Encounter (Signed)
U/S faxed to Dr Sharol Harness at [facility] notified

## 2013-02-07 ENCOUNTER — Telehealth: Payer: Self-pay | Admitting: Pharmacist

## 2013-02-07 NOTE — Telephone Encounter (Signed)
INR over due.  It looks like INR was ordered at appt 01/25/2013 but no results.   Patient needs to have INR and BP rechecked sooner than current appt for 02/2013. Called but patient not at home - left message with her husband.

## 2013-02-07 NOTE — Telephone Encounter (Signed)
Patient called back.  Appt made for 02/12/13 to recheck protime/INR

## 2013-02-12 ENCOUNTER — Ambulatory Visit (INDEPENDENT_AMBULATORY_CARE_PROVIDER_SITE_OTHER): Payer: Medicare Other | Admitting: Pharmacist

## 2013-02-12 ENCOUNTER — Encounter (INDEPENDENT_AMBULATORY_CARE_PROVIDER_SITE_OTHER): Payer: Self-pay

## 2013-02-12 VITALS — BP 156/58 | HR 58 | Ht 65.5 in | Wt 176.5 lb

## 2013-02-12 DIAGNOSIS — I4891 Unspecified atrial fibrillation: Secondary | ICD-10-CM

## 2013-02-12 DIAGNOSIS — I10 Essential (primary) hypertension: Secondary | ICD-10-CM

## 2013-02-12 LAB — POCT INR: INR: 2.2

## 2013-02-12 MED ORDER — LISINOPRIL 40 MG PO TABS: 40.0000 mg | ORAL_TABLET | Freq: Every day | ORAL | Status: DC

## 2013-02-12 MED ORDER — CHLORTHALIDONE 25 MG PO TABS: 25.0000 mg | ORAL_TABLET | Freq: Every morning | ORAL | Status: DC

## 2013-02-12 NOTE — Progress Notes (Signed)
Patient still having significant pedal edema / LEE which is most likely worsened by amlodipine 5mg  daily. She was advised to get compression stockings but she has not gotten these yet.    Filed Vitals:   02/12/13 1057  BP: 156/58  Pulse: 58   Filed Weights   02/12/13 1057  Weight: 176 lb 8 oz (80.06 kg)   Assessment: Hypertension - uncontrolled LEE - worsened by amlodipine and non adherence to compression stockings Anticoagulation - therpautic  Plan: 1.  Discontinue lisinopril HCT 20/12.5mg  2.  Start lisinopril 40mg  1 tablet daily      Start Chlorthalidone 25mg  1 tablet qam      Decrease amlodipine to 2.5mg  daily 3.   Anticoagulation Dose Instructions as of 02/12/2013     Glynis Smiles Tue Wed Thu Fri Sat   New Dose 2 mg 4 mg 2 mg 4 mg 2 mg 4 mg 2 mg    Description       Continue 1 tablet (=4mg ) on mondays, wednesdays and fridays and 1/2 tablet all other days    4.  F/U in 1 month  Henrene Pastor, PharmD, CPP

## 2013-02-12 NOTE — Patient Instructions (Addendum)
Anticoagulation Dose Instructions as of 02/12/2013     Glynis Smiles Tue Wed Thu Fri Sat   New Dose 2 mg 4 mg 2 mg 4 mg 2 mg 4 mg 2 mg    Description       Continue 1 tablet (=4mg ) on mondays, wednesdays and fridays and 1/2 tablet all other days      INR was 2.2 today   Get compression stocking / hose.

## 2013-04-02 ENCOUNTER — Encounter: Payer: Self-pay | Admitting: Pharmacist

## 2013-04-02 ENCOUNTER — Ambulatory Visit (INDEPENDENT_AMBULATORY_CARE_PROVIDER_SITE_OTHER): Payer: Medicare Other | Admitting: Pharmacist

## 2013-04-02 VITALS — BP 152/68 | HR 70

## 2013-04-02 DIAGNOSIS — E039 Hypothyroidism, unspecified: Secondary | ICD-10-CM

## 2013-04-02 DIAGNOSIS — M109 Gout, unspecified: Secondary | ICD-10-CM

## 2013-04-02 DIAGNOSIS — I4891 Unspecified atrial fibrillation: Secondary | ICD-10-CM

## 2013-04-02 DIAGNOSIS — L659 Nonscarring hair loss, unspecified: Secondary | ICD-10-CM

## 2013-04-02 LAB — POCT INR: INR: 1.6

## 2013-04-02 NOTE — Patient Instructions (Signed)
Anticoagulation Dose Instructions as of 04/02/2013     Samantha SmilesSun Mon Tue Wed Thu Fri Sat   New Dose 2 mg 4 mg 2 mg 4 mg 2 mg 4 mg 2 mg    Description       Take extra 1/2 tablet today (04/02/13) and take 1 tablet tomorrow (04/03/13), then resume regular warfarin dose of 1 tablet (=4mg ) on mondays, wednesdays and fridays and 1/2 tablet all other days.      INR was 1.6 today

## 2013-04-03 LAB — THYROID PANEL WITH TSH
FREE THYROXINE INDEX: 2.7 (ref 1.2–4.9)
T3 UPTAKE RATIO: 35 % (ref 24–39)
T4 TOTAL: 7.8 ug/dL (ref 4.5–12.0)
TSH: 4.27 u[IU]/mL (ref 0.450–4.500)

## 2013-04-03 LAB — URIC ACID: URIC ACID: 6.6 mg/dL (ref 2.5–7.1)

## 2013-04-03 NOTE — Progress Notes (Signed)
Patient c/o increased hair loss over the last year.  She is concerned that it is related to some of her medication.   The following orders have been place in relation to alopecia and medications will be reviewed.  Orders Placed This Encounter  Procedures  . Thyroid Panel With TSH  . Uric acid

## 2013-04-05 ENCOUNTER — Telehealth: Payer: Self-pay | Admitting: Pharmacist

## 2013-04-09 MED ORDER — ROSUVASTATIN CALCIUM 20 MG PO TABS: 20.0000 mg | ORAL_TABLET | Freq: Every day | ORAL | Status: DC

## 2013-04-09 NOTE — Telephone Encounter (Signed)
All labs WNL.  Patient c/o hair loss and wants to know if medications coud be causing.  4 of her current medications are associated with post marketing reports of hair loss (less than 1%): Flecanide, uloric, lisinopril and pravastatin.   I have discussed with patient and since cholesterol has not been well controlled with pravastatin we are going to discontinue and change to Crestor 20mg  daily.  Crestor may increase INR but patient has appt to recheck INR in about 2 weeks.  Last INR was 1.6.

## 2013-04-13 ENCOUNTER — Other Ambulatory Visit: Payer: Self-pay | Admitting: Pharmacist

## 2013-04-23 ENCOUNTER — Ambulatory Visit (INDEPENDENT_AMBULATORY_CARE_PROVIDER_SITE_OTHER): Payer: Medicare Other | Admitting: Pharmacist

## 2013-04-23 DIAGNOSIS — R7989 Other specified abnormal findings of blood chemistry: Secondary | ICD-10-CM

## 2013-04-23 DIAGNOSIS — R945 Abnormal results of liver function studies: Secondary | ICD-10-CM

## 2013-04-23 DIAGNOSIS — F411 Generalized anxiety disorder: Secondary | ICD-10-CM

## 2013-04-23 DIAGNOSIS — L659 Nonscarring hair loss, unspecified: Secondary | ICD-10-CM

## 2013-04-23 DIAGNOSIS — I10 Essential (primary) hypertension: Secondary | ICD-10-CM

## 2013-04-23 DIAGNOSIS — I4891 Unspecified atrial fibrillation: Secondary | ICD-10-CM

## 2013-04-23 LAB — POCT INR: INR: 2

## 2013-04-23 MED ORDER — BUSPIRONE HCL 10 MG PO TABS: 10.0000 mg | ORAL_TABLET | Freq: Two times a day (BID) | ORAL | Status: DC

## 2013-04-23 MED ORDER — ROSUVASTATIN CALCIUM 5 MG PO TABS: 5.0000 mg | ORAL_TABLET | Freq: Every day | ORAL | Status: DC

## 2013-04-23 NOTE — Progress Notes (Signed)
Patient ID: Samantha Compton, female   DOB: 03/06/1934, 80 y.o.   MRN: [id number] Subjective:     Samantha Compton is a 80 y.o. female who presents for evaluation of afib / HTN and anticoagulation but also c/o anxiety which she thinks if causing her BP to be elevated. She has the following anxiety symptoms: panic attacks, racing thoughts and restlessness. Onset of symptoms was approximately 1 year ago. Symptoms have been gradually worsening since that time. She denies current suicidal and homicidal ideation. Family history significant for no psychiatric illness. Risk factors: negative life event - husband's health has declined over last year and she if very worried about him.. Previous treatment includes Zoloft. She complains of the following medication side effects: none but she was afraid to take Zoloft because of possible side effects. The following portions of the patient's history were reviewed and updated as appropriate: allergies, current medications, past family history, past medical history, past social history, past surgical history and problem list.  Review of Systems Constitutional: negative    Objective:    Neurologic: Alert and oriented X 3, normal strength and tone. Normal symmetric reflexes. Normal coordination and gait    Assessment:    anxiety disorder - possibly worsening HTN  Plan:    Medications: BuSpar.  Above symptoms and treatment discussed with Dr Modesto CharonWong.  RTC in 2 weeks.  Henrene Pastorammy Haydin Calandra, PharmD, CPP

## 2013-04-23 NOTE — Patient Instructions (Addendum)
Anticoagulation Dose Instructions as of 04/23/2013     Samantha SmilesSun Mon Tue Wed Thu Fri Sat   New Dose 2 mg 4 mg 2 mg 4 mg 2 mg 4 mg 2 mg    Description       Continue warfarin dose of 1 tablet (=4mg ) on mondays, wednesdays and fridays and 1/2 tablet all other days.      INR was 2.0 today

## 2013-04-24 LAB — HEPATIC FUNCTION PANEL
ALBUMIN: 3.9 g/dL (ref 3.5–4.8)
ALK PHOS: 88 IU/L (ref 39–117)
ALT: 17 IU/L (ref 0–32)
AST: 20 IU/L (ref 0–40)
Bilirubin, Direct: 0.09 mg/dL (ref 0.00–0.40)
TOTAL PROTEIN: 6.9 g/dL (ref 6.0–8.5)
Total Bilirubin: 0.3 mg/dL (ref 0.0–1.2)

## 2013-04-25 ENCOUNTER — Telehealth: Payer: Self-pay | Admitting: Pharmacist

## 2013-04-25 NOTE — Telephone Encounter (Signed)
Patient notified of normal lab results.

## 2013-05-14 ENCOUNTER — Other Ambulatory Visit: Payer: Self-pay

## 2013-05-14 MED ORDER — GLIMEPIRIDE 4 MG PO TABS: 4.0000 mg | ORAL_TABLET | Freq: Every day | ORAL | Status: DC

## 2013-05-14 NOTE — Telephone Encounter (Signed)
Last seen 01/25/13 FPW  Last glucose 12/12/12  Wants a 90 day supply

## 2013-05-14 NOTE — Telephone Encounter (Signed)
Patient needs to be seen. Has exceeded time since last visit. Limited quantity refilled. Needs to bring all medications to next appointment.   

## 2013-05-17 ENCOUNTER — Ambulatory Visit: Payer: Self-pay

## 2013-06-07 ENCOUNTER — Other Ambulatory Visit: Payer: Self-pay | Admitting: Pharmacist

## 2013-06-07 ENCOUNTER — Ambulatory Visit (INDEPENDENT_AMBULATORY_CARE_PROVIDER_SITE_OTHER): Payer: Medicare Other | Admitting: Pharmacist

## 2013-06-07 ENCOUNTER — Encounter: Payer: Self-pay | Admitting: Pharmacist

## 2013-06-07 VITALS — BP 144/60 | HR 72 | Wt 169.0 lb

## 2013-06-07 DIAGNOSIS — I4891 Unspecified atrial fibrillation: Secondary | ICD-10-CM

## 2013-06-07 LAB — POCT INR: INR: 2

## 2013-06-07 NOTE — Progress Notes (Signed)
Patient ID: Samantha Compton Thelander, female   DOB: 10/05/1933, [age] y.o.   MRN: [id number] Subjective:     Samantha Compton Woo is a 80 y.o. female who presents for reevaluation of afib / HTN / anxiety and anticoagulation.   She reports that anxiety has improved since starting Buspar 10mg  1/2 to 1 tablet bid about 1 month ago.   She denies current suicidal and homicidal ideation. Family history significant for no psychiatric illness. Risk factors: negative life event - husband's health has declined over last year and she if very worried about him.. Previous treatment includes Zoloft.  The following portions of the patient's history were reviewed and updated as appropriate: allergies, current medications, past family history, past medical history, past social history, past surgical history and problem list.  Review of Systems Constitutional: negative    Objective:    Neurologic: Alert and oriented X 3, normal strength and tone. Normal symmetric reflexes. Normal coordination and gait    Assessment:    anxiety disorder - improved HTN - improved Therapeutic anticoagualtion  Plan:   Continue buspar Anticoagulation Dose Instructions as of 06/07/2013     Glynis SmilesSun Mon Tue Wed Thu Fri Sat   New Dose 2 mg 4 mg 2 mg 4 mg 2 mg 4 mg 2 mg    Description       Continue warfarin dose of 1 tablet (=4mg ) on mondays, wednesdays and fridays and 1/2 tablet all other days.      RTC in 4 weeks - appt make with PCP  Henrene Pastorammy Barabara Motz, PharmD, CPP

## 2013-06-07 NOTE — Patient Instructions (Signed)
Anticoagulation Dose Instructions as of 06/07/2013     Samantha SmilesSun Mon Tue Wed Thu Fri Sat   New Dose 2 mg 4 mg 2 mg 4 mg 2 mg 4 mg 2 mg    Description       Continue warfarin dose of 1 tablet (=4mg ) on mondays, wednesdays and fridays and 1/2 tablet all other days.      INR was 2.0 today

## 2013-06-20 ENCOUNTER — Encounter: Payer: Self-pay | Admitting: *Deleted

## 2013-07-02 ENCOUNTER — Other Ambulatory Visit: Payer: Self-pay | Admitting: *Deleted

## 2013-07-02 NOTE — Telephone Encounter (Signed)
Last ov with Tammy 1/15. Last labs 9/14.  K+ was 4.1.

## 2013-07-06 ENCOUNTER — Telehealth: Payer: Self-pay | Admitting: Family Medicine

## 2013-07-06 MED ORDER — POTASSIUM CHLORIDE CRYS ER 20 MEQ PO TBCR: EXTENDED_RELEASE_TABLET | ORAL | Status: DC

## 2013-07-06 NOTE — Telephone Encounter (Signed)
Call patient : Prescription refilled & sent to pharmacy in [place]. 

## 2013-07-09 ENCOUNTER — Ambulatory Visit: Payer: Self-pay | Admitting: General Practice

## 2013-07-10 ENCOUNTER — Encounter: Payer: Self-pay | Admitting: General Practice

## 2013-07-10 ENCOUNTER — Ambulatory Visit (INDEPENDENT_AMBULATORY_CARE_PROVIDER_SITE_OTHER): Payer: Medicare Other | Admitting: General Practice

## 2013-07-10 VITALS — BP 182/77 | HR 61 | Temp 97.0°F | Ht 65.5 in | Wt 167.0 lb

## 2013-07-10 DIAGNOSIS — I10 Essential (primary) hypertension: Secondary | ICD-10-CM

## 2013-07-10 DIAGNOSIS — E785 Hyperlipidemia, unspecified: Secondary | ICD-10-CM

## 2013-07-10 DIAGNOSIS — E119 Type 2 diabetes mellitus without complications: Secondary | ICD-10-CM

## 2013-07-10 LAB — POCT GLYCOSYLATED HEMOGLOBIN (HGB A1C): Hemoglobin A1C: 5.3

## 2013-07-10 MED ORDER — NEBIVOLOL HCL 20 MG PO TABS: 40.0000 mg | ORAL_TABLET | Freq: Every day | ORAL | Status: DC

## 2013-07-10 NOTE — Patient Instructions (Signed)

## 2013-07-10 NOTE — Progress Notes (Signed)
   Subjective:    Patient ID: Samantha Compton, female    DOB: [birth year], 80 y.o.   MRN: [id number]  HPI Patient presents today for chronic health follow up. History of htn, hld, diabetes, hypothyroidism, hypokalemia, and atrial fib. Reports eating a regular diet an denies regular exercise. Discontinued norvasc 2 weeks ago and reports buspar isn't effective for anxiety.     Review of Systems  Constitutional: Negative for fever and chills.  Respiratory: Negative for chest tightness and shortness of breath.   Cardiovascular: Negative for chest pain and palpitations.  Gastrointestinal: Negative for nausea, vomiting, abdominal pain, diarrhea, constipation and blood in stool.  Genitourinary: Negative for difficulty urinating.  Neurological: Negative for dizziness, weakness and headaches.  Psychiatric/Behavioral: Positive for sleep disturbance. Negative for suicidal ideas and self-injury.       Objective:   Physical Exam  Constitutional: She is oriented to person, place, and time. She appears well-developed and well-nourished.  HENT:  Head: Normocephalic and atraumatic.  Right Ear: External ear normal.  Left Ear: External ear normal.  Mouth/Throat: Oropharynx is clear and moist.  Eyes: EOM are normal.  Left eye cataract surgery   Neck: Normal range of motion. Neck supple. No thyromegaly present.  Cardiovascular: Normal rate, regular rhythm and normal heart sounds.   Pulmonary/Chest: Effort normal and breath sounds normal. No respiratory distress. She exhibits no tenderness.  Abdominal: Soft. Bowel sounds are normal. She exhibits no distension. There is no tenderness.  Lymphadenopathy:    She has no cervical adenopathy.  Neurological: She is alert and oriented to person, place, and time.  Skin: Skin is warm and dry.  Psychiatric: She has a normal mood and affect.          Assessment & Plan:  1. Diabetes  - POCT glycosylated hemoglobin (Hb A1C)  2. Hypertension  -  CMP14+EGFR - Nebivolol HCl 20 MG TABS; Take 2 tablets (40 mg total) by mouth daily.  Dispense: 60 tablet; Refill: 3 -do not restart norvasc -recheck in 1 week (blood pressure)  3. Hyperlipidemia  - Lipid panel -take medications as prescribed -patient has discontinue buspar and doesn't want to restart Labs pending Discussed benefits of regular exercise and healthy eating Discussed sleep hygiene Discussed relaxation techniques May consider trazodone if other methods ineffective in helping sleep Patient verbalized understanding Erby Pian, FNP-C

## 2013-07-11 LAB — LIPID PANEL
CHOLESTEROL TOTAL: 230 mg/dL — AB (ref 100–199)
Chol/HDL Ratio: 4.7 ratio units — ABNORMAL HIGH (ref 0.0–4.4)
HDL: 49 mg/dL (ref >39)
LDL CALC: 156 mg/dL — AB (ref 0–99)
Triglycerides: 125 mg/dL (ref 0–149)
VLDL Cholesterol Cal: 25 mg/dL (ref 5–40)

## 2013-07-11 LAB — CMP14+EGFR
A/G RATIO: 1.4 (ref 1.1–2.5)
ALK PHOS: 82 IU/L (ref 39–117)
ALT: 18 IU/L (ref 0–32)
AST: 20 IU/L (ref 0–40)
Albumin: 4.1 g/dL (ref 3.5–4.8)
BILIRUBIN TOTAL: 0.4 mg/dL (ref 0.0–1.2)
BUN / CREAT RATIO: 18 (ref 11–26)
BUN: 19 mg/dL (ref 8–27)
CO2: 27 mmol/L (ref 18–29)
Calcium: 9.8 mg/dL (ref 8.7–10.3)
Chloride: 101 mmol/L (ref 97–108)
Creatinine, Ser: 1.04 mg/dL — ABNORMAL HIGH (ref 0.57–1.00)
GFR, EST AFRICAN AMERICAN: 60 mL/min/{1.73_m2} (ref >59)
GFR, EST NON AFRICAN AMERICAN: 52 mL/min/{1.73_m2} — AB (ref >59)
Globulin, Total: 3 g/dL (ref 1.5–4.5)
Glucose: 116 mg/dL — ABNORMAL HIGH (ref 65–99)
POTASSIUM: 4.7 mmol/L (ref 3.5–5.2)
Sodium: 143 mmol/L (ref 134–144)
Total Protein: 7.1 g/dL (ref 6.0–8.5)

## 2013-07-16 ENCOUNTER — Telehealth: Payer: Self-pay | Admitting: General Practice

## 2013-07-16 ENCOUNTER — Other Ambulatory Visit: Payer: Self-pay | Admitting: General Practice

## 2013-07-16 NOTE — Telephone Encounter (Signed)
Patient aware.

## 2013-07-16 NOTE — Telephone Encounter (Signed)
Call for results

## 2013-07-16 NOTE — Telephone Encounter (Signed)
Message copied by Almeta MonasSTONE, JANIE M on Mon Jul 16, 2013 11:13 AM ------      Message from: Philomena DohenyHALIBURTON, MAE E      Created: Mon Jul 16, 2013 11:03 AM       Please inform that most labs are within limits. Creatinine slightly elevated, cholesterol elevated, Continue taking medications as prescribed, eat healthy (low fat, baked foods). Have labs rechecked with chronic health follow up in 3 months. ------

## 2013-07-25 ENCOUNTER — Ambulatory Visit (INDEPENDENT_AMBULATORY_CARE_PROVIDER_SITE_OTHER): Payer: Medicare Other | Admitting: Pharmacist

## 2013-07-25 ENCOUNTER — Encounter: Payer: Self-pay | Admitting: Pharmacist

## 2013-07-25 VITALS — BP 174/80 | HR 68

## 2013-07-25 DIAGNOSIS — I10 Essential (primary) hypertension: Secondary | ICD-10-CM

## 2013-07-25 DIAGNOSIS — R600 Localized edema: Secondary | ICD-10-CM

## 2013-07-25 DIAGNOSIS — R609 Edema, unspecified: Secondary | ICD-10-CM

## 2013-07-25 DIAGNOSIS — I4891 Unspecified atrial fibrillation: Secondary | ICD-10-CM

## 2013-07-25 LAB — POCT INR: INR: 1.6

## 2013-07-25 NOTE — Patient Instructions (Signed)
Anticoagulation Dose Instructions as of 07/25/2013     Samantha SmilesSun Mon Tue Wed Thu Fri Sat   New Dose 2 mg 4 mg 2 mg 4 mg 2 mg 4 mg 2 mg    Description       Take extra 1/2 tablet today (07/25/13) and 1 tablet tomorrow (07/26/2013), then continue warfarin dose of 1 tablet (=4mg ) on mondays, wednesdays and fridays and 1/2 tablet all other days.      INR was 1.6 today

## 2013-08-03 ENCOUNTER — Other Ambulatory Visit: Payer: Self-pay | Admitting: Family Medicine

## 2013-08-07 ENCOUNTER — Other Ambulatory Visit: Payer: Self-pay

## 2013-08-07 DIAGNOSIS — E119 Type 2 diabetes mellitus without complications: Secondary | ICD-10-CM

## 2013-08-07 MED ORDER — METFORMIN HCL 500 MG PO TABS: 500.0000 mg | ORAL_TABLET | Freq: Two times a day (BID) | ORAL | Status: DC

## 2013-08-13 ENCOUNTER — Encounter: Payer: Self-pay | Admitting: Pharmacist

## 2013-08-13 ENCOUNTER — Ambulatory Visit (INDEPENDENT_AMBULATORY_CARE_PROVIDER_SITE_OTHER): Payer: Medicare Other | Admitting: Pharmacist

## 2013-08-13 VITALS — BP 160/70 | HR 70

## 2013-08-13 DIAGNOSIS — I10 Essential (primary) hypertension: Secondary | ICD-10-CM

## 2013-08-13 DIAGNOSIS — I4891 Unspecified atrial fibrillation: Secondary | ICD-10-CM

## 2013-08-13 LAB — POCT INR: INR: 1.8

## 2013-08-13 MED ORDER — RAMIPRIL 10 MG PO CAPS: 10.0000 mg | ORAL_CAPSULE | Freq: Every day | ORAL | Status: DC

## 2013-08-13 NOTE — Addendum Note (Signed)
Addended by: Henrene PastorECKARD, Zi Sek on: 08/13/2013 03:52 PM   Modules accepted: Orders

## 2013-08-13 NOTE — Patient Instructions (Signed)
Anticoagulation Dose Instructions as of 08/13/2013     Samantha SmilesSun Mon Tue Wed Thu Fri Sat   New Dose 2 mg 4 mg 4 mg 4 mg 2 mg 4 mg 2 mg    Description       Increase warfarin to 1 tablet (=4mg ) on mondays, tuesdays, wednesdays and fridays and 1/2 tablet all other days.      INR was 1.8 today

## 2013-08-13 NOTE — Progress Notes (Signed)
Hypertension  Patient has stopped amlodipine due to edema.  BP is elevated today but better than in past.   Recommend D/C lisinopril  Start ramipril 10mg  1 capsule daily RTC in 1 month to check BP and Protime

## 2013-09-07 ENCOUNTER — Other Ambulatory Visit: Payer: Self-pay | Admitting: *Deleted

## 2013-09-07 MED ORDER — GLIMEPIRIDE 4 MG PO TABS: 4.0000 mg | ORAL_TABLET | Freq: Every day | ORAL | Status: DC

## 2013-09-26 ENCOUNTER — Ambulatory Visit (INDEPENDENT_AMBULATORY_CARE_PROVIDER_SITE_OTHER): Payer: Medicare Other | Admitting: Pharmacist

## 2013-09-26 VITALS — BP 158/62 | HR 68

## 2013-09-26 DIAGNOSIS — I4891 Unspecified atrial fibrillation: Secondary | ICD-10-CM

## 2013-09-26 LAB — POCT INR: INR: 1.8

## 2013-09-26 NOTE — Progress Notes (Signed)
Patient was suppose to change from lisinpril to ramipril but has not yet.  BP still elevated.   She will get Rx today.  Follow up with new PCP 10/24/13 - appt made today

## 2013-09-26 NOTE — Patient Instructions (Signed)
Anticoagulation Dose Instructions as of 09/26/2013     Samantha SmilesSun Mon Tue Wed Thu Fri Sat   New Dose 2 mg 4 mg 4 mg 4 mg 2 mg 4 mg 2 mg    Description       Take 1 and 1/2 tablets of warfarin today (wednesday 09/26/13), then increase warfarin to 1 tablet (=4mg ) on mondays, tuesdays, wednesdays and fridays and 1/2 tablet all other days.      INR was 1.8 today (too low / thick)

## 2013-10-24 ENCOUNTER — Ambulatory Visit (INDEPENDENT_AMBULATORY_CARE_PROVIDER_SITE_OTHER): Payer: Medicare Other | Admitting: Family Medicine

## 2013-10-24 ENCOUNTER — Encounter: Payer: Self-pay | Admitting: Family Medicine

## 2013-10-24 VITALS — BP 183/70 | HR 59 | Temp 98.1°F | Ht 65.5 in | Wt 163.0 lb

## 2013-10-24 DIAGNOSIS — IMO0001 Reserved for inherently not codable concepts without codable children: Secondary | ICD-10-CM

## 2013-10-24 DIAGNOSIS — E119 Type 2 diabetes mellitus without complications: Secondary | ICD-10-CM

## 2013-10-24 DIAGNOSIS — I480 Paroxysmal atrial fibrillation: Secondary | ICD-10-CM

## 2013-10-24 DIAGNOSIS — I4891 Unspecified atrial fibrillation: Secondary | ICD-10-CM

## 2013-10-24 DIAGNOSIS — E1165 Type 2 diabetes mellitus with hyperglycemia: Principal | ICD-10-CM

## 2013-10-24 LAB — POCT INR: INR: 2.3

## 2013-10-24 LAB — POCT GLYCOSYLATED HEMOGLOBIN (HGB A1C): Hemoglobin A1C: 5.2

## 2013-10-24 MED ORDER — ALPRAZOLAM 0.25 MG PO TABS: 0.2500 mg | ORAL_TABLET | Freq: Every evening | ORAL | Status: DC | PRN

## 2013-10-24 MED ORDER — FUROSEMIDE 20 MG PO TABS: ORAL_TABLET | ORAL | Status: DC

## 2013-10-24 MED ORDER — METFORMIN HCL 500 MG PO TABS: 500.0000 mg | ORAL_TABLET | Freq: Two times a day (BID) | ORAL | Status: DC

## 2013-10-24 MED ORDER — GLIMEPIRIDE 4 MG PO TABS: 4.0000 mg | ORAL_TABLET | Freq: Every day | ORAL | Status: DC

## 2013-10-24 NOTE — Progress Notes (Signed)
   Subjective:    Patient ID: Samantha Compton, female    DOB: 1933-11-06, 80 y.o.   MRN: [id number]  HPI 80 year old female who presents today to recheck her diabetes, hypertension, hyperlipidemia, when asked how she feels, she says fair, she is not sleeping well due to anxiety over her husband. She has been bothered recently by dependent edema. She is on amlodipine and takes chlorthalidone daily for edema. Her blood pressures have fluctuated on visits here regularly ranging from 076-808 systolic.    Review of Systems  Constitutional: Negative.   HENT: Negative.   Eyes: Negative.   Respiratory: Negative.   Cardiovascular: Positive for leg swelling.  Gastrointestinal: Negative.   Endocrine: Negative.   Genitourinary: Negative.   Neurological: Negative.        Objective:   Physical Exam  Constitutional: She is oriented to person, place, and time. She appears well-developed and well-nourished.  Eyes: Conjunctivae and EOM are normal.  Neck: Normal range of motion. Neck supple.  Cardiovascular: Normal rate, regular rhythm and normal heart sounds.   Pulmonary/Chest: Effort normal and breath sounds normal.  Abdominal: Soft. Bowel sounds are normal.  Musculoskeletal: Normal range of motion.  Neurological: She is alert and oriented to person, place, and time. She has normal reflexes.  Skin: Skin is warm and dry.  Psychiatric: She has a normal mood and affect. Her behavior is normal. Thought content normal.          Assessment & Plan:  1. Type II or unspecified type diabetes mellitus without mention of complication, uncontrolled metform and glimeperide doing okay; last A1C was 5.3 - POCT glycosylated hemoglobin (Hb A1C) - BMP8+EGFR  2. Type II or unspecified type diabetes mellitus without mention of complication, not stated as uncontrolled See above - metFORMIN (GLUCOPHAGE) 500 MG tablet; Take 1 tablet (500 mg total) by mouth 2 (two) times daily with a meal.  Dispense: 180 tablet;  Refill: 1  3. Paroxysmal atrial fibrillation Followed by card in [place].  On Tambocor since 1999  4. Afib To see pharm today - POCT INR  Wardell Honour MD

## 2013-10-24 NOTE — Patient Instructions (Signed)
Anticoagulation Dose Instructions as of 10/24/2013     Glynis SmilesSun Mon Tue Wed Thu Fri Sat   New Dose 2 mg 4 mg 4 mg 4 mg 2 mg 4 mg 2 mg    Description       Continue current warfarin dose of  1 tablet (=4mg ) on mondays, tuesdays, wednesdays and fridays and 1/2 tablet all other days.      INR was 2.3 today

## 2013-10-25 LAB — BMP8+EGFR
BUN / CREAT RATIO: 18 (ref 11–26)
BUN: 20 mg/dL (ref 8–27)
CO2: 27 mmol/L (ref 18–29)
CREATININE: 1.12 mg/dL — AB (ref 0.57–1.00)
Calcium: 9.6 mg/dL (ref 8.7–10.3)
Chloride: 99 mmol/L (ref 97–108)
GFR calc Af Amer: 54 mL/min/{1.73_m2} — ABNORMAL LOW (ref >59)
GFR, EST NON AFRICAN AMERICAN: 47 mL/min/{1.73_m2} — AB (ref >59)
Glucose: 76 mg/dL (ref 65–99)
Potassium: 4.1 mmol/L (ref 3.5–5.2)
SODIUM: 141 mmol/L (ref 134–144)

## 2013-11-26 ENCOUNTER — Ambulatory Visit (INDEPENDENT_AMBULATORY_CARE_PROVIDER_SITE_OTHER): Payer: Medicare Other | Admitting: Pharmacist

## 2013-11-26 DIAGNOSIS — I4891 Unspecified atrial fibrillation: Secondary | ICD-10-CM

## 2013-11-26 LAB — POCT INR: INR: 1.9

## 2013-11-26 MED ORDER — ALLOPURINOL 100 MG PO TABS: 200.0000 mg | ORAL_TABLET | Freq: Every day | ORAL | Status: DC

## 2013-11-26 NOTE — Progress Notes (Signed)
Patient requests samples of Uloric.  We do not have samples of Uloric anymore.   She states that she has not taken in over 1 week and cannot afford Uloric.  Changed to allopurinol  daily.  May increase INR so did not adjust warfarin dose even though INR was 1.9 - subtherapeutic today.  Recheck INR in 2 weeks.

## 2013-11-26 NOTE — Patient Instructions (Addendum)
Anticoagulation Dose Instructions as of 11/26/2013     Samantha Compton Tue Wed Thu Fri Sat   New Dose 2 mg 4 mg 4 mg 4 mg 2 mg 4 mg 2 mg    Description       Continue warfarin dose to 1/2 tablet (= ) on sundays, thursdays and saturdays only.  Take 1 tablet (= ) all other days.       INR was 1.9 today

## 2013-11-28 ENCOUNTER — Encounter: Payer: Self-pay | Admitting: Family Medicine

## 2013-11-28 ENCOUNTER — Ambulatory Visit (INDEPENDENT_AMBULATORY_CARE_PROVIDER_SITE_OTHER): Payer: Medicare Other | Admitting: Family Medicine

## 2013-11-28 VITALS — BP 181/74 | HR 61 | Temp 97.8°F | Ht 65.5 in | Wt 162.0 lb

## 2013-11-28 DIAGNOSIS — E039 Hypothyroidism, unspecified: Secondary | ICD-10-CM

## 2013-11-28 DIAGNOSIS — E118 Type 2 diabetes mellitus with unspecified complications: Secondary | ICD-10-CM

## 2013-11-28 DIAGNOSIS — E785 Hyperlipidemia, unspecified: Secondary | ICD-10-CM

## 2013-11-28 DIAGNOSIS — I10 Essential (primary) hypertension: Secondary | ICD-10-CM

## 2013-11-28 MED ORDER — TRIAMCINOLONE ACETONIDE 0.1 % EX CREA: 1.0000 "application " | TOPICAL_CREAM | Freq: Two times a day (BID) | CUTANEOUS | Status: DC

## 2013-11-28 NOTE — Patient Instructions (Addendum)
Schedule eye exam Place anxiety patient instructions here.

## 2013-11-28 NOTE — Progress Notes (Signed)
   Subjective:    Patient ID: Samantha Compton, female    DOB: 18-Sep-1933, 80 y.o.   MRN: [id number]  HPI  80 year old female who is here with several concerns today. Her hands are becoming worse with arthritis where she describes some contractures and pain. She also has intermittent headaches on the left posterior portion of her head that seemed to involve the trapezius muscle on one side. In addition she complains of various spots in the groin arms and trunk that are pruritic. She does tell me again today about having to take care of her husband and do all the work around the house and feels lots of tension and stress from that.    Review of Systems  Constitutional: Positive for fatigue.  HENT: Negative.   Respiratory: Negative.   Cardiovascular: Negative.   Gastrointestinal: Negative.   Genitourinary: Negative.   Musculoskeletal: Positive for arthralgias and neck pain.  Skin: Positive for rash (isolated lesions).  Neurological: Negative.        Objective:   Physical Exam  Constitutional: She appears well-developed.  HENT:  Head: Normocephalic.  Eyes: Pupils are equal, round, and reactive to light.  Abdominal: Soft.  Musculoskeletal: Normal range of motion. She exhibits tenderness (hands without deformity).  Skin: Rash (lesions are most suspicious for bites) noted.   BP 181/74  Pulse 61  Temp(Src) 97.8 F (36.6 C) (Oral)  Ht 5' 5.5" (1.664 m)  Wt 162 lb (73.483 kg)  BMI 26.54 kg/m2       Assessment & Plan:  1. Hypothyroidism, unspecified hypothyroidism type   2. Essential hypertension BP's flucuate; currrent meds: lisinopril, nebivolol, chlorthalidone  3. HLD (hyperlipidemia)   4. Type 2 diabetes mellitus with complication  5. Arthritis: suggested tylenol extra strength  6. Anxiety: just picked up Xanax; encouraged use as needed  Frederica Kuster MD

## 2013-12-07 ENCOUNTER — Other Ambulatory Visit: Payer: Self-pay | Admitting: Family Medicine

## 2013-12-10 NOTE — Telephone Encounter (Signed)
Last ov 11/28/13. Last K+- 4.1.

## 2013-12-17 ENCOUNTER — Ambulatory Visit (INDEPENDENT_AMBULATORY_CARE_PROVIDER_SITE_OTHER): Payer: Medicare Other | Admitting: Pharmacist

## 2013-12-17 DIAGNOSIS — I4891 Unspecified atrial fibrillation: Secondary | ICD-10-CM

## 2013-12-17 LAB — POCT INR: INR: 2.6

## 2013-12-17 NOTE — Patient Instructions (Signed)
Anticoagulation Dose Instructions as of 12/17/2013     Samantha Compton Tue Wed Thu Fri Sat   New Dose 2 mg 4 mg 4 mg 4 mg 2 mg 4 mg 2 mg    Description       Continue warfarin dose to 1/2 tablet (= ) on sundays, thursdays and saturdays only.  Take 1 tablet (= ) all other days.       INR was 2.6 today

## 2013-12-27 ENCOUNTER — Encounter: Payer: Self-pay | Admitting: Family Medicine

## 2013-12-27 ENCOUNTER — Ambulatory Visit (INDEPENDENT_AMBULATORY_CARE_PROVIDER_SITE_OTHER): Payer: Medicare Other | Admitting: Family Medicine

## 2013-12-27 VITALS — BP 185/73 | HR 68 | Temp 97.1°F | Ht 65.5 in | Wt 164.0 lb

## 2013-12-27 DIAGNOSIS — I10 Essential (primary) hypertension: Secondary | ICD-10-CM

## 2013-12-27 DIAGNOSIS — Z23 Encounter for immunization: Secondary | ICD-10-CM

## 2013-12-27 DIAGNOSIS — F411 Generalized anxiety disorder: Secondary | ICD-10-CM

## 2013-12-27 MED ORDER — CLONIDINE HCL 0.1 MG PO TABS: 0.1000 mg | ORAL_TABLET | Freq: Two times a day (BID) | ORAL | Status: DC

## 2013-12-27 MED ORDER — LEVOTHYROXINE SODIUM 50 MCG PO TABS: ORAL_TABLET | ORAL | Status: DC

## 2013-12-27 MED ORDER — ALPRAZOLAM 0.5 MG PO TABS: 0.5000 mg | ORAL_TABLET | Freq: Every evening | ORAL | Status: DC | PRN

## 2013-12-27 MED ORDER — ALPRAZOLAM 0.25 MG PO TABS: 0.5000 mg | ORAL_TABLET | Freq: Every evening | ORAL | Status: DC | PRN

## 2013-12-27 MED ORDER — ALLOPURINOL 100 MG PO TABS: 200.0000 mg | ORAL_TABLET | Freq: Every day | ORAL | Status: DC

## 2013-12-27 NOTE — Progress Notes (Signed)
   Subjective:    Patient ID: Samantha Compton, female    DOB: 04/04/1933, 80 y.o.   MRN: [id number]  HPI [id number] year old female here to followup hypertension. I think she may be out of her Bystolic because she depends on samples and we are out. Her systolic pressure continues to be elevated. She tells me that when she came to the office here one time we gave her a peel that controlled her blood pressure and I suspect that was clonidine so I will start her on clonidine 0.1 mg twice a day. She feels like the Xanax 0.25 was not effective so I would like to double that dose before we conclude about its effectiveness.    Review of Systems  Constitutional: Positive for fatigue.  HENT: Negative.   Respiratory: Positive for shortness of breath (when lying flat).   Cardiovascular: Positive for leg swelling.  Gastrointestinal: Negative.   Genitourinary: Negative.   Musculoskeletal: Positive for arthralgias.  Hematological: Negative.   Psychiatric/Behavioral: Positive for sleep disturbance.       Objective:   Physical Exam  Constitutional: She is oriented to person, place, and time. She appears well-developed and well-nourished.  Eyes: Conjunctivae and EOM are normal.  Neck: Normal range of motion. Neck supple.  Cardiovascular: Normal rate, regular rhythm and normal heart sounds.   Pulmonary/Chest: Effort normal and breath sounds normal.  Abdominal: Soft. Bowel sounds are normal.  Musculoskeletal: Normal range of motion.  Neurological: She is alert and oriented to person, place, and time. She has normal reflexes.  Skin: Skin is warm and dry.  Psychiatric: She has a normal mood and affect. Her behavior is normal. Thought content normal.     BP 185/73  Pulse 68  Temp(Src) 97.1 F (36.2 C) (Oral)  Ht 5' 5.5" (1.664 m)  Wt 164 lb (74.39 kg)  BMI 26.87 kg/m2     Assessment & Plan:  1. Essential hypertension Add clonidine  2. Anxiety, generalized Increase Xanax to 0.5 mg  Frederica KusterStephen M Miller  MD ncrease Xanax to 0.5 mg

## 2014-01-09 ENCOUNTER — Other Ambulatory Visit: Payer: Self-pay | Admitting: Family Medicine

## 2014-01-24 ENCOUNTER — Other Ambulatory Visit: Payer: Self-pay | Admitting: *Deleted

## 2014-01-24 MED ORDER — ALPRAZOLAM 0.5 MG PO TABS: 0.5000 mg | ORAL_TABLET | Freq: Every evening | ORAL | Status: DC | PRN

## 2014-01-24 NOTE — Telephone Encounter (Signed)
This prescription was sent at the time of her visit to the pharmacy we have on record

## 2014-01-24 NOTE — Telephone Encounter (Signed)
Patient last seen in office on 12-27-13. Rx last filled on 12-27-13 for #30. Please advise. If approved please route to Pool A so nurse can [facility] [place]

## 2014-01-29 ENCOUNTER — Other Ambulatory Visit: Payer: Self-pay

## 2014-01-29 NOTE — Telephone Encounter (Signed)
Last seen 12/27/13 Dr Hyacinth MeekerMiller  If approved route to nurse [facility]

## 2014-01-30 MED ORDER — ALPRAZOLAM 0.5 MG PO TABS: 0.5000 mg | ORAL_TABLET | Freq: Every evening | ORAL | Status: DC | PRN

## 2014-01-30 NOTE — Telephone Encounter (Signed)
[facility] into [facility]

## 2014-02-04 ENCOUNTER — Telehealth: Payer: Self-pay | Admitting: Family Medicine

## 2014-02-06 ENCOUNTER — Other Ambulatory Visit: Payer: Self-pay | Admitting: Family Medicine

## 2014-02-07 NOTE — Telephone Encounter (Signed)
Rx refilled per protocol. Pt's next appt is in December for her HTN

## 2014-02-17 ENCOUNTER — Other Ambulatory Visit: Payer: Self-pay | Admitting: Nurse Practitioner

## 2014-02-19 ENCOUNTER — Encounter (INDEPENDENT_AMBULATORY_CARE_PROVIDER_SITE_OTHER): Payer: Self-pay

## 2014-02-19 ENCOUNTER — Other Ambulatory Visit: Payer: Medicare Other

## 2014-02-19 DIAGNOSIS — E118 Type 2 diabetes mellitus with unspecified complications: Secondary | ICD-10-CM

## 2014-02-19 NOTE — Progress Notes (Signed)
Lab only 

## 2014-02-20 LAB — MICROALBUMIN, URINE: Microalbumin, Urine: 1702.5 ug/mL — ABNORMAL HIGH (ref 0.0–17.0)

## 2014-02-25 ENCOUNTER — Telehealth: Payer: Self-pay

## 2014-02-25 NOTE — Telephone Encounter (Signed)
-----   Message from Frederica KusterStephen M Miller, MD sent at 02/21/2014 12:34 PM EST ----- Microalbumin is elevated; she is already taking ACE; will follow

## 2014-02-25 NOTE — Telephone Encounter (Signed)
Left results on home am as per DPR of 05/2012 

## 2014-03-04 ENCOUNTER — Ambulatory Visit (INDEPENDENT_AMBULATORY_CARE_PROVIDER_SITE_OTHER): Payer: Medicare Other | Admitting: Pharmacist

## 2014-03-04 ENCOUNTER — Other Ambulatory Visit: Payer: Self-pay | Admitting: *Deleted

## 2014-03-04 DIAGNOSIS — I4891 Unspecified atrial fibrillation: Secondary | ICD-10-CM

## 2014-03-04 DIAGNOSIS — I480 Paroxysmal atrial fibrillation: Secondary | ICD-10-CM

## 2014-03-04 DIAGNOSIS — I10 Essential (primary) hypertension: Secondary | ICD-10-CM

## 2014-03-04 LAB — POCT INR: INR: 1.9

## 2014-03-04 MED ORDER — NEBIVOLOL HCL 20 MG PO TABS: 40.0000 mg | ORAL_TABLET | Freq: Every day | ORAL | Status: DC

## 2014-03-04 MED ORDER — ALPRAZOLAM 0.5 MG PO TABS: 0.5000 mg | ORAL_TABLET | Freq: Every evening | ORAL | Status: DC | PRN

## 2014-03-04 NOTE — Telephone Encounter (Signed)
Last ov 10/15. Last refill 01/30/14. Route to nurse pool. If approved [facility] [place].

## 2014-03-04 NOTE — Patient Instructions (Signed)
Anticoagulation Dose Instructions as of 03/04/2014      Glynis SmilesSun Mon Tue Wed Thu Fri Sat   New Dose 2 mg 4 mg 4 mg 4 mg 2 mg 4 mg 2 mg    Description        Take an extra 1/2 tablet today - Monday, December 7th only.  Then continue warfarin dose of 1/2 tablet (= 2mg ) on sundays, thursdays and saturdays only.  Take 1 tablet (= 4mg ) all other days.       INR was 1.9 today

## 2014-03-27 ENCOUNTER — Other Ambulatory Visit: Payer: Self-pay | Admitting: Family Medicine

## 2014-04-09 ENCOUNTER — Other Ambulatory Visit: Payer: Self-pay | Admitting: *Deleted

## 2014-04-09 MED ORDER — ALPRAZOLAM 0.5 MG PO TABS: 0.5000 mg | ORAL_TABLET | Freq: Every evening | ORAL | Status: DC | PRN

## 2014-04-09 NOTE — Telephone Encounter (Signed)
Pt requesting refill on her xanax 0.5 mg. Last ordered 03/04/14 #30 1 PO QHS prn no refills, has appt 05/17/14 with dr Hyacinth Meekermiller. If approved please route to nurse pools to be called [facility] [phone].

## 2014-04-10 NOTE — Telephone Encounter (Signed)
rx called in to pharmacy vm

## 2014-04-23 ENCOUNTER — Other Ambulatory Visit: Payer: Self-pay | Admitting: *Deleted

## 2014-04-23 MED ORDER — FUROSEMIDE 20 MG PO TABS: ORAL_TABLET | ORAL | Status: AC

## 2014-05-09 ENCOUNTER — Other Ambulatory Visit: Payer: Self-pay | Admitting: *Deleted

## 2014-05-09 MED ORDER — ALPRAZOLAM 0.5 MG PO TABS: 0.5000 mg | ORAL_TABLET | Freq: Every evening | ORAL | Status: DC | PRN

## 2014-05-09 NOTE — Telephone Encounter (Signed)
Last filled 04/10/14, last seen by miller on 12/27/13. [facility]

## 2014-05-09 NOTE — Telephone Encounter (Signed)
Please call in xanax with 1 refills 

## 2014-05-10 NOTE — Telephone Encounter (Signed)
rx called into pharmacy

## 2014-05-15 ENCOUNTER — Other Ambulatory Visit: Payer: Self-pay | Admitting: *Deleted

## 2014-05-15 MED ORDER — GLIMEPIRIDE 4 MG PO TABS: 4.0000 mg | ORAL_TABLET | Freq: Every day | ORAL | Status: DC

## 2014-05-15 MED ORDER — ALLOPURINOL 100 MG PO TABS: ORAL_TABLET | ORAL | Status: DC

## 2014-05-17 ENCOUNTER — Encounter: Payer: Self-pay | Admitting: Family Medicine

## 2014-05-17 ENCOUNTER — Ambulatory Visit (INDEPENDENT_AMBULATORY_CARE_PROVIDER_SITE_OTHER): Payer: Medicare Other | Admitting: Family Medicine

## 2014-05-17 VITALS — BP 168/64 | HR 56 | Temp 96.9°F | Ht 65.5 in | Wt 158.0 lb

## 2014-05-17 DIAGNOSIS — I4891 Unspecified atrial fibrillation: Secondary | ICD-10-CM | POA: Diagnosis not present

## 2014-05-17 DIAGNOSIS — F411 Generalized anxiety disorder: Secondary | ICD-10-CM

## 2014-05-17 DIAGNOSIS — E118 Type 2 diabetes mellitus with unspecified complications: Secondary | ICD-10-CM

## 2014-05-17 DIAGNOSIS — E039 Hypothyroidism, unspecified: Secondary | ICD-10-CM

## 2014-05-17 DIAGNOSIS — I10 Essential (primary) hypertension: Secondary | ICD-10-CM

## 2014-05-17 DIAGNOSIS — E785 Hyperlipidemia, unspecified: Secondary | ICD-10-CM

## 2014-05-17 LAB — POCT INR: INR: 2

## 2014-05-17 MED ORDER — GABAPENTIN 100 MG PO CAPS: ORAL_CAPSULE | ORAL | Status: DC

## 2014-05-17 NOTE — Progress Notes (Signed)
Subjective:    Patient ID: Samantha Compton, female    DOB: 06-28-1933, 81 y.o.   MRN: [id number]  HPI 81 year old female with several problems today. I think a lot of her issues especially her blood pressure stem from the fact that she is under lots of pressure tending to her invalid husband. She also has a hard time keeping her medicines depending on samples of Bystolic. I did start clonidine  last time and she takes it without undue somnolence she has been out of Bystolic for the past several days.  She also complains of numbness in her hands. She is status post carpal tunnel syndrome and does have a long history of diabetes. She does have some pain in her arm which makes me wonder about cervical disc disease but the neuropathy would still be treatable using gabapentin.  Third complaint is pain in her left hip without radiation. She describes it as severe when she makes a sudden move. Her family tells her that is probably arthritis.  Patient Active Problem List   Diagnosis Date Noted  . Anxiety, generalized 04/23/2013  . Alopecia 04/23/2013  . Hypothyroidism 04/02/2013  . Dyspnea 12/12/2012  . Pedal edema 12/12/2012  . HTN (hypertension) 07/06/2012  . HLD (hyperlipidemia) 07/06/2012  . DM (diabetes mellitus) 07/06/2012  . Gout 07/06/2012  . Myalgia 07/06/2012  . Afib 06/22/2012   Outpatient Encounter Prescriptions as of 05/17/2014  Medication Sig  . allopurinol (ZYLOPRIM) 100 MG tablet TAKE 2 TABLETS (200 MG TOTAL) BY MOUTH DAILY.  Marland Kitchen ALPRAZolam (XANAX) 0.5 MG tablet Take 1 tablet (0.5 mg total) by mouth at bedtime as needed for anxiety.  . chlorthalidone (HYGROTON) 25 MG tablet TAKE 1 TABLET (25 MG TOTAL) BY MOUTH EVERY MORNING.  . cholecalciferol (VITAMIN D) 1000 UNITS tablet Take 1,000 Units by mouth daily.  . cloNIDine (CATAPRES) 0.1 MG tablet Take 1 tablet (0.1 mg total) by mouth 2 (two) times daily before a meal.  . flecainide (TAMBOCOR) 100 MG tablet Take 100 mg by mouth 2 (two)  times daily.  . furosemide (LASIX) 20 MG tablet Take on Monday and Thursday and leave off chlorthalidone on those days  . glimepiride (AMARYL) 4 MG tablet Take 1 tablet (4 mg total) by mouth daily before breakfast.  . KLOR-CON M20 20 MEQ tablet TAKE 1 TABLET BY MOUTH EVERY DAY  . levothyroxine (SYNTHROID, LEVOTHROID) 50 MCG tablet TAKE 1 TABLET BY MOUTH IN THE MORNING  . lisinopril (PRINIVIL,ZESTRIL) 40 MG tablet Take 40 mg by mouth daily.  . metFORMIN (GLUCOPHAGE) 500 MG tablet Take 1 tablet (500 mg total) by mouth 2 (two) times daily with a meal.  . Nebivolol HCl 20 MG TABS Take 2 tablets (40 mg total) by mouth daily.  Marland Kitchen pyridOXINE (VITAMIN B-6) 25 MG tablet Take 25 mg by mouth daily.  Marland Kitchen warfarin (COUMADIN) 4 MG tablet TAKE 1 TABLET DAILY OR PER COUMADIN CLINIC  . [DISCONTINUED] triamcinolone cream (KENALOG) 0.1 % Apply 1 application topically 2 (two) times daily.     Review of Systems  Constitutional: Negative.   HENT: Negative.   Respiratory: Negative.   Cardiovascular: Negative.   Gastrointestinal: Negative.   Genitourinary: Negative.   Psychiatric/Behavioral: Positive for sleep disturbance. The patient is nervous/anxious.        Objective:   Physical Exam  Constitutional: She is oriented to person, place, and time. She appears well-developed and well-nourished.  Pulmonary/Chest: Effort normal and breath sounds normal.  Musculoskeletal: Normal range of motion.  Injected trigger  point left hip with combination Marcaine and Depo-Medrol which she tolerated well  Neurological: She is alert and oriented to person, place, and time. She has normal reflexes.  Psychiatric: She has a normal mood and affect. Her behavior is normal. Judgment and thought content normal.    BP 168/64 mmHg  Pulse 56  Temp(Src) 96.9 F (36.1 C) (Oral)  Ht 5' 5.5" (1.664 m)  Wt 158 lb (71.668 kg)  BMI 25.88 kg/m2       Assessment & Plan:  1. Essential hypertension Continue with Bystolic 10 mg  twice a day and clonidine 0.1 mg twice a day  2. HLD (hyperlipidemia)   3. Hypothyroidism, unspecified hypothyroidism type   4. Type 2 diabetes mellitus with complication   5. Anxiety, generalized Increase alprazolam to 1 mg at bedtime  6. Afib  - POCT INR  Frederica KusterStephen M Miller MD

## 2014-05-18 ENCOUNTER — Other Ambulatory Visit: Payer: Self-pay | Admitting: Nurse Practitioner

## 2014-06-12 ENCOUNTER — Other Ambulatory Visit: Payer: Self-pay | Admitting: *Deleted

## 2014-06-12 MED ORDER — GABAPENTIN 100 MG PO CAPS: ORAL_CAPSULE | ORAL | Status: AC

## 2014-06-18 ENCOUNTER — Other Ambulatory Visit: Payer: Self-pay | Admitting: Nurse Practitioner

## 2014-06-18 MED ORDER — POTASSIUM CHLORIDE CRYS ER 20 MEQ PO TBCR: 20.0000 meq | EXTENDED_RELEASE_TABLET | Freq: Every day | ORAL | Status: AC

## 2014-06-19 ENCOUNTER — Ambulatory Visit (INDEPENDENT_AMBULATORY_CARE_PROVIDER_SITE_OTHER): Payer: Medicare Other | Admitting: Pharmacist

## 2014-06-19 DIAGNOSIS — I4891 Unspecified atrial fibrillation: Secondary | ICD-10-CM

## 2014-06-19 LAB — POCT INR: INR: 2

## 2014-06-19 NOTE — Patient Instructions (Signed)
Anticoagulation Dose Instructions as of 06/19/2014      Glynis SmilesSun Mon Tue Wed Thu Fri Sat   New Dose 2 mg 4 mg 4 mg 4 mg 2 mg 4 mg 2 mg    Description        Continue warfarin dose of 1/2 tablet (= 2mg ) on sundays, thursdays and saturdays only.  Take 1 tablet (= 4mg ) all other days.       INR was 2.0 today

## 2014-06-26 ENCOUNTER — Other Ambulatory Visit: Payer: Self-pay | Admitting: *Deleted

## 2014-06-26 MED ORDER — GLIMEPIRIDE 4 MG PO TABS: 4.0000 mg | ORAL_TABLET | Freq: Every day | ORAL | Status: AC

## 2014-07-08 ENCOUNTER — Other Ambulatory Visit: Payer: Self-pay | Admitting: *Deleted

## 2014-07-08 MED ORDER — CLONIDINE HCL 0.1 MG PO TABS: 0.1000 mg | ORAL_TABLET | Freq: Two times a day (BID) | ORAL | Status: DC

## 2014-07-09 ENCOUNTER — Other Ambulatory Visit: Payer: Self-pay | Admitting: *Deleted

## 2014-07-09 MED ORDER — ALLOPURINOL 100 MG PO TABS: ORAL_TABLET | ORAL | Status: DC

## 2014-07-10 ENCOUNTER — Encounter (HOSPITAL_COMMUNITY): Payer: Self-pay | Admitting: *Deleted

## 2014-07-10 ENCOUNTER — Emergency Department (HOSPITAL_COMMUNITY)
Admission: EM | Admit: 2014-07-10 | Discharge: 2014-07-10 | Disposition: A | Discharge status: Home / Self Care | Payer: Medicare Other | Attending: Emergency Medicine | Admitting: Emergency Medicine

## 2014-07-10 ENCOUNTER — Emergency Department (HOSPITAL_COMMUNITY): Payer: Medicare Other

## 2014-07-10 DIAGNOSIS — Z880 Allergy status to penicillin: Secondary | ICD-10-CM | POA: Insufficient documentation

## 2014-07-10 DIAGNOSIS — G8929 Other chronic pain: Secondary | ICD-10-CM | POA: Diagnosis not present

## 2014-07-10 DIAGNOSIS — R42 Dizziness and giddiness: Secondary | ICD-10-CM | POA: Insufficient documentation

## 2014-07-10 DIAGNOSIS — Z87891 Personal history of nicotine dependence: Secondary | ICD-10-CM | POA: Insufficient documentation

## 2014-07-10 DIAGNOSIS — Z8659 Personal history of other mental and behavioral disorders: Secondary | ICD-10-CM | POA: Insufficient documentation

## 2014-07-10 DIAGNOSIS — Z79899 Other long term (current) drug therapy: Secondary | ICD-10-CM | POA: Insufficient documentation

## 2014-07-10 DIAGNOSIS — E119 Type 2 diabetes mellitus without complications: Secondary | ICD-10-CM | POA: Insufficient documentation

## 2014-07-10 DIAGNOSIS — Z7901 Long term (current) use of anticoagulants: Secondary | ICD-10-CM | POA: Diagnosis not present

## 2014-07-10 DIAGNOSIS — E039 Hypothyroidism, unspecified: Secondary | ICD-10-CM | POA: Insufficient documentation

## 2014-07-10 DIAGNOSIS — I10 Essential (primary) hypertension: Secondary | ICD-10-CM | POA: Insufficient documentation

## 2014-07-10 DIAGNOSIS — M797 Fibromyalgia: Secondary | ICD-10-CM | POA: Diagnosis not present

## 2014-07-10 DIAGNOSIS — N390 Urinary tract infection, site not specified: Secondary | ICD-10-CM

## 2014-07-10 DIAGNOSIS — R0602 Shortness of breath: Secondary | ICD-10-CM | POA: Diagnosis not present

## 2014-07-10 HISTORY — DX: Unspecified atrial fibrillation: I48.91

## 2014-07-10 LAB — BASIC METABOLIC PANEL
ANION GAP: 10 (ref 5–15)
BUN: 24 mg/dL — ABNORMAL HIGH (ref 6–23)
CO2: 28 mmol/L (ref 19–32)
Calcium: 9.1 mg/dL (ref 8.4–10.5)
Chloride: 102 mmol/L (ref 96–112)
Creatinine, Ser: 1.01 mg/dL (ref 0.50–1.10)
GFR calc non Af Amer: 52 mL/min — ABNORMAL LOW (ref >90)
GFR, EST AFRICAN AMERICAN: 60 mL/min — AB (ref >90)
Glucose, Bld: 111 mg/dL — ABNORMAL HIGH (ref 70–99)
Potassium: 3.8 mmol/L (ref 3.5–5.1)
SODIUM: 140 mmol/L (ref 135–145)

## 2014-07-10 LAB — CBC WITH DIFFERENTIAL/PLATELET
BASOS ABS: 0.1 10*3/uL (ref 0.0–0.1)
Basophils Relative: 1 % (ref 0–1)
EOS PCT: 16 % — AB (ref 0–5)
Eosinophils Absolute: 1.4 10*3/uL — ABNORMAL HIGH (ref 0.0–0.7)
HCT: 34 % — ABNORMAL LOW (ref 36.0–46.0)
HEMOGLOBIN: 11 g/dL — AB (ref 12.0–15.0)
LYMPHS ABS: 2.5 10*3/uL (ref 0.7–4.0)
Lymphocytes Relative: 28 % (ref 12–46)
MCH: 29 pg (ref 26.0–34.0)
MCHC: 32.4 g/dL (ref 30.0–36.0)
MCV: 89.7 fL (ref 78.0–100.0)
MONO ABS: 0.4 10*3/uL (ref 0.1–1.0)
Monocytes Relative: 5 % (ref 3–12)
Neutro Abs: 4.5 10*3/uL (ref 1.7–7.7)
Neutrophils Relative %: 50 % (ref 43–77)
Platelets: 254 10*3/uL (ref 150–400)
RBC: 3.79 MIL/uL — ABNORMAL LOW (ref 3.87–5.11)
RDW: 16.2 % — ABNORMAL HIGH (ref 11.5–15.5)
WBC: 8.9 10*3/uL (ref 4.0–10.5)

## 2014-07-10 LAB — URINALYSIS, ROUTINE W REFLEX MICROSCOPIC
BILIRUBIN URINE: NEGATIVE
Glucose, UA: NEGATIVE mg/dL
KETONES UR: NEGATIVE mg/dL
Nitrite: NEGATIVE
PROTEIN: 100 mg/dL — AB
Specific Gravity, Urine: 1.02 (ref 1.005–1.030)
Urobilinogen, UA: 0.2 mg/dL (ref 0.0–1.0)
pH: 7.5 (ref 5.0–8.0)

## 2014-07-10 LAB — PROTIME-INR
INR: 2 — AB (ref 0.00–1.49)
PROTHROMBIN TIME: 22.8 s — AB (ref 11.6–15.2)

## 2014-07-10 LAB — URINE MICROSCOPIC-ADD ON

## 2014-07-10 LAB — D-DIMER, QUANTITATIVE: D-Dimer, Quant: 0.51 ug/mL-FEU — ABNORMAL HIGH (ref 0.00–0.48)

## 2014-07-10 MED ORDER — CEPHALEXIN 500 MG PO CAPS: 500.0000 mg | ORAL_CAPSULE | Freq: Four times a day (QID) | ORAL | Status: DC

## 2014-07-10 MED ORDER — LORAZEPAM 0.5 MG PO TABS
0.5000 mg | ORAL_TABLET | Freq: Once | ORAL | Status: AC
  Administered 2014-07-10: 0.5 mg via ORAL
  Filled 2014-07-10: qty 1

## 2014-07-10 MED ORDER — LORAZEPAM 0.5 MG PO TABS: 0.5000 mg | ORAL_TABLET | Freq: Three times a day (TID) | ORAL | Status: DC | PRN

## 2014-07-10 NOTE — ED Notes (Addendum)
Pt feels sob, both hands "numb" caring for her husband  "My nerves can't take it anymore.  Dizzy at times  Sob  Edema of ankles

## 2014-07-10 NOTE — ED Notes (Signed)
Pt has been caring for husband and is under lots of stress due to it. Pt states has been SOB, dizzy, leg swelling, blood sugar going up and down and nauseated  for 2 months. Pt states also has pain and numbnes in bilateral arms but knows that she has arthritis and carpel tunnel in both arms.

## 2014-07-10 NOTE — ED Provider Notes (Signed)
CSN: [id number]     Arrival date & time 07/10/14  1435 History   First MD Initiated Contact with Patient 07/10/14 1537     Chief Complaint  Patient presents with  . Shortness of Breath     (Consider location/radiation/quality/duration/timing/severity/associated sxs/prior Treatment) HPI   Samantha Compton is a 81 y.o. female who is here today primarily for evaluation of a sensation of dizziness that is worse with movement. This is been present intermittently for several weeks. She is also concerned that her blood pressure is high that she has numbness in her hands that she has shortness of breath at times and an overwhelming sensation that she "can't take it anymore". She feels under stress because she can't do enough for her husband who has several disabilities. She is here in the ED, with her son who is helpful and knowledgeable about her care. She sees a PCP clinic, but does not have regular provider there. She's taking her medications without improvement.   Past Medical History  Diagnosis Date  . Dysrhythmia     AF  . Hypertension   . Diabetes mellitus   . Hyperlipemia   . Fibromyalgia   . Arthritis   . Hypothyroidism   . Chronic pain   . Rheumatic fever     hx  . Anxiety   . GERD (gastroesophageal reflux disease)     no meds now  . Headache(784.0)   . Atrial fibrillation    Past Surgical History  Procedure Laterality Date  . Cholecystectomy    . Appendectomy    . Carpal tunnel release  2011    right and left 2011  . Ulnar nerve transposition  12/01/2011    Procedure: ULNAR NERVE DECOMPRESSION/TRANSPOSITION;  Surgeon: Nicki Reaper, MD;  Location: [facility];  Service: Orthopedics;  Laterality: Left;  decompression possible transposition left ulnar nerve   History reviewed. No pertinent family history. History  Substance Use Topics  . Smoking status: Former Smoker    Types: Cigarettes    Quit date: 07/06/1992  . Smokeless tobacco: Not on file  . Alcohol  Use: No   OB History    No data available     Review of Systems  All other systems reviewed and are negative.     Allergies  Norvasc and Penicillins  Home Medications   Prior to Admission medications   Medication Sig Start Date End Date Taking? Authorizing Provider  allopurinol (ZYLOPRIM) 100 MG tablet TAKE 2 TABLETS (200 MG TOTAL) BY MOUTH DAILY. Patient taking differently: Take 200 mg by mouth daily.  07/09/14  Yes Frederica Kuster, MD  chlorthalidone (HYGROTON) 25 MG tablet TAKE 1 TABLET (25 MG TOTAL) BY MOUTH EVERY MORNING. 01/11/14  Yes Frederica Kuster, MD  cholecalciferol (VITAMIN D) 1000 UNITS tablet Take 1,000 Units by mouth daily.   Yes Historical Provider, MD  cloNIDine (CATAPRES) 0.1 MG tablet Take 1 tablet (0.1 mg total) by mouth 2 (two) times daily before a meal. 07/08/14  Yes Frederica Kuster, MD  flecainide (TAMBOCOR) 100 MG tablet Take 100 mg by mouth 2 (two) times daily.   Yes Historical Provider, MD  furosemide (LASIX) 20 MG tablet Take on Monday and Thursday and leave off chlorthalidone on those days Patient taking differently: Take 20 mg by mouth as needed for fluid. *When this medication is taken, HOLD Chlorthalidone* 04/23/14  Yes Frederica Kuster, MD  gabapentin (NEURONTIN) 100 MG capsule 1 tablet at bedtime for 3 days. Inc to  2 tablets at bedtime for 3 days if no better. If no better can increase to 3 tablets. Patient taking differently: Take 100-300 mg by mouth at bedtime. 1 tablet at bedtime for 3 days. Inc to 2 tablets at bedtime for 3 days if no better. If no better can increase to 3 tablets. 06/12/14  Yes Frederica Kuster, MD  glimepiride (AMARYL) 4 MG tablet Take 1 tablet (4 mg total) by mouth daily before breakfast. 06/26/14  Yes Frederica Kuster, MD  levothyroxine (SYNTHROID, LEVOTHROID) 50 MCG tablet TAKE 1 TABLET BY MOUTH IN THE MORNING Patient taking differently: Take 50 mcg by mouth every morning.  12/27/13  Yes Frederica Kuster, MD  lisinopril  (PRINIVIL,ZESTRIL) 40 MG tablet Take 40 mg by mouth daily.   Yes Historical Provider, MD  metFORMIN (GLUCOPHAGE) 500 MG tablet TAKE 1 TABLET (500 MG TOTAL) BY MOUTH 2 (TWO) TIMES DAILY WITH A MEAL. Patient taking differently: TAKE 1 TABLET (500 MG TOTAL) BY MOUTH 2 (TWO) TIMES DAILY WITH A MEAL AS NEEDED FOR BLOOD SUGARS 06/18/14  Yes Frederica Kuster, MD  Nebivolol HCl 20 MG TABS Take 2 tablets (40 mg total) by mouth daily. 03/04/14  Yes Tammy Eckard, PHARMD  potassium chloride SA (KLOR-CON M20) 20 MEQ tablet Take 1 tablet (20 mEq total) by mouth daily. 06/18/14  Yes Frederica Kuster, MD  pyridOXINE (VITAMIN B-6) 25 MG tablet Take 25 mg by mouth daily.   Yes Historical Provider, MD  warfarin (COUMADIN) 4 MG tablet TAKE 1 TABLET DAILY OR PER COUMADIN CLINIC Patient taking differently: TAKE  ON MONDAYS THROUGH WEDNESDAYS, THEN TAKE  ONE HALF TABLET ( )  ON THURSDAYS, THEN TAKE  ON FRIDAY, THEN TAKE ONE-HALF TABLET ON SATURDAYS AND SUNDAYS 05/20/14  Yes Frederica Kuster, MD  cephALEXin (KEFLEX) 500 MG capsule Take 1 capsule (500 mg total) by mouth 4 (four) times daily. 07/10/14   Mancel Bale, MD  LORazepam (ATIVAN) 0.5 MG tablet Take 1 tablet (0.5 mg total) by mouth every 8 (eight) hours as needed (Dizziness). 07/10/14   Mancel Bale, MD   BP 172/58 mmHg  Pulse 76  Temp(Src) 97.9 F (36.6 C) (Oral)  Resp 19  Ht  (1.651 m)  Wt 150 lb (68.04 kg)  BMI 24.96 kg/m2  SpO2 99% Physical Exam  Constitutional: She is oriented to person, place, and time. She appears well-developed.  Elderly, frail  HENT:  Head: Normocephalic and atraumatic.  Right Ear: External ear normal.  Left Ear: External ear normal.  Eyes: Conjunctivae and EOM are normal. Pupils are equal, round, and reactive to light.  Neck: Normal range of motion and phonation normal. Neck supple.  Cardiovascular: Normal rate, regular rhythm and normal heart sounds.   Pulmonary/Chest: Effort normal and breath sounds normal. She  exhibits no bony tenderness.  Abdominal: Soft. There is no tenderness.  Musculoskeletal: Normal range of motion.  Neurological: She is alert and oriented to person, place, and time. No cranial nerve deficit or sensory deficit. She exhibits normal muscle tone. Coordination normal.  No dysarthria, aphasia or nystagmus. No ataxia.  Skin: Skin is warm, dry and intact.  Psychiatric: She has a normal mood and affect. Her behavior is normal. Judgment and thought content normal.  Nursing note and vitals reviewed.   ED Course  Procedures (including critical care time)  Medications  LORazepam (ATIVAN) tablet 0.5 mg (0.5 mg Oral Given 07/10/14 1636)    Patient Vitals for the past 24 hrs:  BP Temp Temp  src Pulse Resp SpO2 Height Weight  07/10/14 1830 172/58 mmHg - - 76 - 99 % - -  07/10/14 1800 168/63 mmHg - - 78 - 98 % - -  07/10/14 1700 178/58 mmHg - - 77 - 100 % - -  07/10/14 1530 170/64 mmHg - - 71 19 97 % - -  07/10/14 1440 (!) 203/69 mmHg 97.9 F (36.6 C) Oral 86 20 100 %  (1.651 m) 150 lb (68.04 kg)    6:09 PM Reevaluation with update and discussion. After initial assessment and treatment, an updated evaluation reveals patient feels better at this time. She was able to ambulate to the bathroom without assistance. Findings discussed with patient and family members, all questions were answered.Mancel Bale L    Labs Review Labs Reviewed  BASIC METABOLIC PANEL - Abnormal; Notable for the following:    Glucose, Bld 111 (*)    BUN 24 (*)    GFR calc non Af Amer 52 (*)    GFR calc Af Amer 60 (*)    All other components within normal limits  CBC WITH DIFFERENTIAL/PLATELET - Abnormal; Notable for the following:    RBC 3.79 (*)    Hemoglobin 11.0 (*)    HCT 34.0 (*)    RDW 16.2 (*)    Eosinophils Relative 16 (*)    Eosinophils Absolute 1.4 (*)    All other components within normal limits  D-DIMER, QUANTITATIVE - Abnormal; Notable for the following:    D-Dimer, Quant 0.51 (*)     All other components within normal limits  PROTIME-INR - Abnormal; Notable for the following:    Prothrombin Time 22.8 (*)    INR 2.00 (*)    All other components within normal limits  URINALYSIS, ROUTINE W REFLEX MICROSCOPIC - Abnormal; Notable for the following:    Hgb urine dipstick MODERATE (*)    Protein, ur 100 (*)    Leukocytes, UA TRACE (*)    All other components within normal limits  URINE MICROSCOPIC-ADD ON - Abnormal; Notable for the following:    Squamous Epithelial / LPF FEW (*)    Bacteria, UA FEW (*)    All other components within normal limits  URINE CULTURE    Imaging Review Dg Chest 2 View  07/10/2014   CLINICAL DATA:  81 year old female with shortness of breath and orthopnea  EXAM: CHEST  2 VIEW  COMPARISON:  Prior chest x-ray 12/15/2012  FINDINGS: Stable cardiomegaly. Atherosclerotic calcification present in the transverse aorta. Progressed pulmonary vascular congestion now with bilateral Kerley B-lines consistent with mild interstitial edema. Slightly increased bibasilar atelectasis with probable trace pleural effusions. Edema is superimposed on a background hyperinflation and chronic bronchitic change. The bones are osteopenic. No acute osseous abnormality.  IMPRESSION: 1. Mild CHF. 2. Probable trace bilateral pleural effusions. 3. Stable cardiomegaly.   Electronically Signed   By: Malachy Moan M.D.   On: 07/10/2014 16:42     EKG Interpretation   Date/Time:  Wednesday July 10 2014 15:43:30 EDT Ventricular Rate:  76 PR Interval:  291 QRS Duration: 132 QT Interval:  470 QTC Calculation: 528 R Axis:   -9 Text Interpretation:  Sinus rhythm Prolonged PR interval Probable left  ventricular hypertrophy Nonspecific T abnormalities, lateral leads  Prolonged QT interval Artifact No old tracing to compare Confirmed by  [facility]  MD, Zaelyn Noack [id number]) on 07/10/2014 4:12:36 PM      MDM   Final diagnoses:  Vertigo  Urinary tract infection without hematuria, site  unspecified    Nonspecific dizziness, possible peripheral vertigo. Incidental urinary tract infection. Doubt CVA, metabolic instability or serious bacterial infection. Patient with significant home stressors, and likely has secondary anxiety to that.   Nursing Notes Reviewed/ Care Coordinated Applicable Imaging Reviewed Interpretation of Laboratory Data incorporated into ED treatment  The patient appears reasonably screened and/or stabilized for discharge and I doubt any other medical condition or other [facility] requiring further screening, evaluation, or treatment in the ED at this time prior to discharge.  Plan: Home Medications- Ativan TID prn vertigo or anxiety; Home Treatments- rest; return here if the recommended treatment, does not improve the symptoms; Recommended follow up- PCP prn     Mancel BaleElliott Wrenly Lauritsen, MD 07/11/14 [phone]

## 2014-07-10 NOTE — Discharge Instructions (Signed)
Dizziness °Dizziness is a common problem. It is a feeling of unsteadiness or light-headedness. You may feel like you are about to faint. Dizziness can lead to injury if you stumble or fall. A person of any age group can suffer from dizziness, but dizziness is more common in older adults. °CAUSES  °Dizziness can be caused by many different things, including: °· Middle ear problems. °· Standing for too long. °· Infections. °· An allergic reaction. °· Aging. °· An emotional response to something, such as the sight of blood. °· Side effects of medicines. °· Tiredness. °· Problems with circulation or blood pressure. °· Excessive use of alcohol or medicines, or illegal drug use. °· Breathing too fast (hyperventilation). °· An irregular heart rhythm (arrhythmia). °· A low red blood cell count (anemia). °· Pregnancy. °· Vomiting, diarrhea, fever, or other illnesses that cause body fluid loss (dehydration). °· Diseases or conditions such as Parkinson's disease, high blood pressure (hypertension), diabetes, and thyroid problems. °· Exposure to extreme heat. °DIAGNOSIS  °Your health care provider will ask about your symptoms, perform a physical exam, and perform an electrocardiogram (ECG) to record the electrical activity of your heart. Your health care provider may also perform other heart or blood tests to determine the cause of your dizziness. These may include: °· Transthoracic echocardiogram (TTE). During echocardiography, sound waves are used to evaluate how blood flows through your heart. °· Transesophageal echocardiogram (TEE). °· Cardiac monitoring. This allows your health care provider to monitor your heart rate and rhythm in real time. °· Holter monitor. This is a portable device that records your heartbeat and can help diagnose heart arrhythmias. It allows your health care provider to track your heart activity for several days if needed. °· Stress tests by exercise or by giving medicine that makes the heart beat  faster. °TREATMENT  °Treatment of dizziness depends on the cause of your symptoms and can vary greatly. °HOME CARE INSTRUCTIONS  °· Drink enough fluids to keep your urine clear or pale yellow. This is especially important in very hot weather. In older adults, it is also important in cold weather. °· Take your medicine exactly as directed if your dizziness is caused by medicines. When taking blood pressure medicines, it is especially important to get up slowly. °· Rise slowly from chairs and steady yourself until you feel okay. °· In the morning, first sit up on the side of the bed. When you feel okay, stand slowly while holding onto something until you know your balance is fine. °· Move your legs often if you need to stand in one place for a long time. Tighten and relax your muscles in your legs while standing. °· Have someone stay with you for 1-2 days if dizziness continues to be a problem. Do this until you feel you are well enough to stay alone. Have the person call your health care provider if he or she notices changes in you that are concerning. °· Do not drive or use heavy machinery if you feel dizzy. °· Do not drink alcohol. °SEEK IMMEDIATE MEDICAL CARE IF:  °· Your dizziness or light-headedness gets worse. °· You feel nauseous or vomit. °· You have problems talking, walking, or using your arms, hands, or legs. °· You feel weak. °· You are not thinking clearly or you have trouble forming sentences. It may take a friend or family member to notice this. °· You have chest pain, abdominal pain, shortness of breath, or sweating. °· Your vision changes. °· You notice   any bleeding. °· You have side effects from medicine that seems to be getting worse rather than better. °MAKE SURE YOU:  °· Understand these instructions. °· Will watch your condition. °· Will get help right away if you are not doing well or get worse. °Document Released: 09/08/2000 Document Revised: 03/20/2013 Document Reviewed: 10/02/2010 °ExitCare®  Patient Information ©2015 [employer]. This information is not intended to replace advice given to you by your health care provider. Make sure you discuss any questions you have with your health care provider. ° °Urinary Tract Infection °Urinary tract infections (UTIs) can develop anywhere along your urinary tract. Your urinary tract is your body's drainage system for removing wastes and extra water. Your urinary tract includes two kidneys, two ureters, a bladder, and a urethra. Your kidneys are a pair of bean-shaped organs. Each kidney is about the size of your fist. They are located below your ribs, one on each side of your spine. °CAUSES °Infections are caused by microbes, which are microscopic organisms, including fungi, viruses, and bacteria. These organisms are so small that they can only be seen through a microscope. Bacteria are the microbes that most commonly cause UTIs. °SYMPTOMS  °Symptoms of UTIs may vary by age and gender of the patient and by the location of the infection. Symptoms in young women typically include a frequent and intense urge to urinate and a painful, burning feeling in the bladder or urethra during urination. Older women and men are more likely to be tired, shaky, and weak and have muscle aches and abdominal pain. A fever may mean the infection is in your kidneys. Other symptoms of a kidney infection include pain in your back or sides below the ribs, nausea, and vomiting. °DIAGNOSIS °To diagnose a UTI, your caregiver will ask you about your symptoms. Your caregiver also will ask to provide a urine sample. The urine sample will be tested for bacteria and white blood cells. White blood cells are made by your body to help fight infection. °TREATMENT  °Typically, UTIs can be treated with medication. Because most UTIs are caused by a bacterial infection, they usually can be treated with the use of antibiotics. The choice of antibiotic and length of treatment depend on your symptoms and  the type of bacteria causing your infection. °HOME CARE INSTRUCTIONS °· If you were prescribed antibiotics, take them exactly as your caregiver instructs you. Finish the medication even if you feel better after you have only taken some of the medication. °· Drink enough water and fluids to keep your urine clear or pale yellow. °· Avoid caffeine, tea, and carbonated beverages. They tend to irritate your bladder. °· Empty your bladder often. Avoid holding urine for long periods of time. °· Empty your bladder before and after sexual intercourse. °· After a bowel movement, women should cleanse from front to back. Use each tissue only once. °SEEK MEDICAL CARE IF:  °· You have back pain. °· You develop a fever. °· Your symptoms do not begin to resolve within 3 days. °SEEK IMMEDIATE MEDICAL CARE IF:  °· You have severe back pain or lower abdominal pain. °· You develop chills. °· You have nausea or vomiting. °· You have continued burning or discomfort with urination. °MAKE SURE YOU:  °· Understand these instructions. °· Will watch your condition. °· Will get help right away if you are not doing well or get worse. °Document Released: 12/23/2004 Document Revised: 09/14/2011 Document Reviewed: 04/23/2011 °ExitCare® Patient Information ©2015 [employer]. This information is not   intended to replace advice given to you by your health care provider. Make sure you discuss any questions you have with your health care provider. ° °

## 2014-07-11 LAB — URINE CULTURE: Colony Count: 15000

## 2014-07-12 DIAGNOSIS — M79603 Pain in arm, unspecified: Secondary | ICD-10-CM | POA: Diagnosis not present

## 2014-07-23 ENCOUNTER — Other Ambulatory Visit: Payer: Self-pay | Admitting: *Deleted

## 2014-07-23 ENCOUNTER — Ambulatory Visit (INDEPENDENT_AMBULATORY_CARE_PROVIDER_SITE_OTHER): Payer: Medicare Other | Admitting: Family Medicine

## 2014-07-23 DIAGNOSIS — I482 Chronic atrial fibrillation, unspecified: Secondary | ICD-10-CM

## 2014-07-23 LAB — POCT INR: INR: 2

## 2014-07-23 MED ORDER — MECLIZINE HCL 12.5 MG PO TABS: 12.5000 mg | ORAL_TABLET | Freq: Three times a day (TID) | ORAL | Status: DC | PRN

## 2014-07-23 NOTE — Telephone Encounter (Signed)
Last filled 06/11/14, last seen 07/23/14. Call into [facility] 

## 2014-07-23 NOTE — Progress Notes (Signed)
Subjective:    Patient ID: Samantha Compton, female    DOB: 06/14/1933, 81 y.o.   MRN: [id number]  HPI Chief Complaint  Patient presents with  . Dizziness    lightheaded, unsteady gait. Has had several falls with no injury.  . Coagulation Disorder    needs INR today   Patient Active Problem List   Diagnosis Date Noted  . Anxiety, generalized 04/23/2013  . Alopecia 04/23/2013  . Hypothyroidism 04/02/2013  . Dyspnea 12/12/2012  . Pedal edema 12/12/2012  . HTN (hypertension) 07/06/2012  . HLD (hyperlipidemia) 07/06/2012  . DM (diabetes mellitus) 07/06/2012  . Gout 07/06/2012  . Myalgia 07/06/2012  . Afib 06/22/2012   Outpatient Encounter Prescriptions as of 07/23/2014  Medication Sig  . allopurinol (ZYLOPRIM) 100 MG tablet TAKE 2 TABLETS (200 MG TOTAL) BY MOUTH DAILY. (Patient taking differently: Take 200 mg by mouth daily. )  . cephALEXin (KEFLEX) 500 MG capsule Take 1 capsule (500 mg total) by mouth 4 (four) times daily.  . chlorthalidone (HYGROTON) 25 MG tablet TAKE 1 TABLET (25 MG TOTAL) BY MOUTH EVERY MORNING.  . cloNIDine (CATAPRES) 0.1 MG tablet Take 1 tablet (0.1 mg total) by mouth 2 (two) times daily before a meal.  . flecainide (TAMBOCOR) 100 MG tablet Take 100 mg by mouth 2 (two) times daily.  . furosemide (LASIX) 20 MG tablet Take on Monday and Thursday and leave off chlorthalidone on those days (Patient taking differently: Take 20 mg by mouth as needed for fluid. *When this medication is taken, HOLD Chlorthalidone*)  . gabapentin (NEURONTIN) 100 MG capsule 1 tablet at bedtime for 3 days. Inc to 2 tablets at bedtime for 3 days if no better. If no better can increase to 3 tablets. (Patient taking differently: Take 100-300 mg by mouth at bedtime. 1 tablet at bedtime for 3 days. Inc to 2 tablets at bedtime for 3 days if no better. If no better can increase to 3 tablets.)  . glimepiride (AMARYL) 4 MG tablet Take 1 tablet (4 mg total) by mouth daily before breakfast.  .  levothyroxine (SYNTHROID, LEVOTHROID) 50 MCG tablet TAKE 1 TABLET BY MOUTH IN THE MORNING (Patient taking differently: Take 50 mcg by mouth every morning. )  . lisinopril (PRINIVIL,ZESTRIL) 40 MG tablet Take 40 mg by mouth daily.  . metFORMIN (GLUCOPHAGE) 500 MG tablet TAKE 1 TABLET (500 MG TOTAL) BY MOUTH 2 (TWO) TIMES DAILY WITH A MEAL. (Patient taking differently: TAKE 1 TABLET (500 MG TOTAL) BY MOUTH 2 (TWO) TIMES DAILY WITH A MEAL AS NEEDED FOR BLOOD SUGARS)  . Nebivolol HCl 20 MG TABS Take 2 tablets (40 mg total) by mouth daily.  . potassium chloride SA (KLOR-CON M20) 20 MEQ tablet Take 1 tablet (20 mEq total) by mouth daily.  Marland Kitchen. warfarin (COUMADIN) 4 MG tablet TAKE 1 TABLET DAILY OR PER COUMADIN CLINIC (Patient taking differently: TAKE 4MG  ON MONDAYS THROUGH WEDNESDAYS, THEN TAKE  ONE HALF TABLET (2MG )  ON THURSDAYS, THEN TAKE 4MG  ON FRIDAY, THEN TAKE ONE-HALF TABLET ON SATURDAYS AND SUNDAYS)  . [DISCONTINUED] cholecalciferol (VITAMIN D) 1000 UNITS tablet Take 1,000 Units by mouth daily.  Marland Kitchen. ALPRAZolam (XANAX) 0.5 MG tablet Take 1 tablet by mouth at bedtime.  Marland Kitchen. LORazepam (ATIVAN) 0.5 MG tablet Take 1 tablet (0.5 mg total) by mouth every 8 (eight) hours as needed (Dizziness). (Patient not taking: Reported on 07/23/2014)  . [DISCONTINUED] pyridOXINE (VITAMIN B-6) 25 MG tablet Take 25 mg by mouth daily.      Review  of Systems     Objective:   Physical Exam  Temp(Src) 97.1 F (36.2 C) (Oral)  Ht  (1.651 m)  Wt 159 lb (72.122 kg)  BMI 26.46 kg/m2       Assessment & Plan:

## 2014-07-23 NOTE — Patient Instructions (Addendum)
Use a pill box with a separate morning and night compartment to organize medicines.  Hold Ativan, Neurontin, and Xanax. Take Antivert three times a day as needed.   Dizziness Dizziness is a common problem. It is a feeling of unsteadiness or light-headedness. You may feel like you are about to faint. Dizziness can lead to injury if you stumble or fall. A person of any age group can suffer from dizziness, but dizziness is more common in older adults. CAUSES  Dizziness can be caused by many different things, including:  Middle ear problems.  Standing for too long.  Infections.  An allergic reaction.  Aging.  An emotional response to something, such as the sight of blood.  Side effects of medicines.  Tiredness.  Problems with circulation or blood pressure.  Excessive use of alcohol or medicines, or illegal drug use.  Breathing too fast (hyperventilation).  An irregular heart rhythm (arrhythmia).  A low red blood cell count (anemia).  Pregnancy.  Vomiting, diarrhea, fever, or other illnesses that cause body fluid loss (dehydration).  Diseases or conditions such as Parkinson's disease, high blood pressure (hypertension), diabetes, and thyroid problems.  Exposure to extreme heat. DIAGNOSIS  Your health care provider will ask about your symptoms, perform a physical exam, and perform an electrocardiogram (ECG) to record the electrical activity of your heart. Your health care provider may also perform other heart or blood tests to determine the cause of your dizziness. These may include:  Transthoracic echocardiogram (TTE). During echocardiography, sound waves are used to evaluate how blood flows through your heart.  Transesophageal echocardiogram (TEE).  Cardiac monitoring. This allows your health care provider to monitor your heart rate and rhythm in real time.  Holter monitor. This is a portable device that records your heartbeat and can help diagnose heart arrhythmias. It  allows your health care provider to track your heart activity for several days if needed.  Stress tests by exercise or by giving medicine that makes the heart beat faster. TREATMENT  Treatment of dizziness depends on the cause of your symptoms and can vary greatly. HOME CARE INSTRUCTIONS   Drink enough fluids to keep your urine clear or pale yellow. This is especially important in very hot weather. In older adults, it is also important in cold weather.  Take your medicine exactly as directed if your dizziness is caused by medicines. When taking blood pressure medicines, it is especially important to get up slowly.  Rise slowly from chairs and steady yourself until you feel okay.  In the morning, first sit up on the side of the bed. When you feel okay, stand slowly while holding onto something until you know your balance is fine.  Move your legs often if you need to stand in one place for a long time. Tighten and relax your muscles in your legs while standing.  Have someone stay with you for 1-2 days if dizziness continues to be a problem. Do this until you feel you are well enough to stay alone. Have the person call your health care provider if he or she notices changes in you that are concerning.  Do not drive or use heavy machinery if you feel dizzy.  Do not drink alcohol. SEEK IMMEDIATE MEDICAL CARE IF:   Your dizziness or light-headedness gets worse.  You feel nauseous or vomit.  You have problems talking, walking, or using your arms, hands, or legs.  You feel weak.  You are not thinking clearly or you have trouble forming sentences.  It may take a friend or family member to notice this.  You have chest pain, abdominal pain, shortness of breath, or sweating.  Your vision changes.  You notice any bleeding.  You have side effects from medicine that seems to be getting worse rather than better. MAKE SURE YOU:   Understand these instructions.  Will watch your  condition.  Will get help right away if you are not doing well or get worse. Document Released: 09/08/2000 Document Revised: 03/20/2013 Document Reviewed: 10/02/2010 [facility] Patient Information 2015 [place], [place]. This information is not intended to replace advice given to you by your health care provider. Make sure you discuss any questions you have with your health care provider. rate morning and night boxes to organize medications.

## 2014-07-24 ENCOUNTER — Other Ambulatory Visit: Payer: Self-pay

## 2014-07-24 NOTE — Telephone Encounter (Signed)
Last seen 07/23/14 Dr Hyacinth MeekerMiller  If approved route to nurse to call into [facility]

## 2014-08-03 ENCOUNTER — Other Ambulatory Visit: Payer: Self-pay | Admitting: Family Medicine

## 2014-08-07 ENCOUNTER — Other Ambulatory Visit: Payer: Self-pay

## 2014-08-07 MED ORDER — LISINOPRIL 40 MG PO TABS: 40.0000 mg | ORAL_TABLET | Freq: Every day | ORAL | Status: AC

## 2014-08-12 ENCOUNTER — Encounter: Payer: Self-pay | Admitting: Family Medicine

## 2014-08-19 ENCOUNTER — Ambulatory Visit (INDEPENDENT_AMBULATORY_CARE_PROVIDER_SITE_OTHER): Payer: Medicare Other | Admitting: Pharmacist

## 2014-08-19 ENCOUNTER — Other Ambulatory Visit: Payer: Self-pay | Admitting: *Deleted

## 2014-08-19 DIAGNOSIS — E038 Other specified hypothyroidism: Secondary | ICD-10-CM | POA: Diagnosis not present

## 2014-08-19 DIAGNOSIS — E1140 Type 2 diabetes mellitus with diabetic neuropathy, unspecified: Secondary | ICD-10-CM | POA: Diagnosis not present

## 2014-08-19 DIAGNOSIS — E034 Atrophy of thyroid (acquired): Secondary | ICD-10-CM

## 2014-08-19 DIAGNOSIS — I482 Chronic atrial fibrillation, unspecified: Secondary | ICD-10-CM

## 2014-08-19 LAB — POCT GLYCOSYLATED HEMOGLOBIN (HGB A1C): Hemoglobin A1C: 5.4

## 2014-08-19 LAB — POCT INR: INR: 2.5

## 2014-08-19 MED ORDER — METFORMIN HCL ER 500 MG PO TB24: 500.0000 mg | ORAL_TABLET | Freq: Every day | ORAL | Status: AC

## 2014-08-19 NOTE — Progress Notes (Signed)
Change metformin to XR and decrase to 1 tablet daily (A1c was 5.4% today) hopefully this will help some with diarrhea.  Also checking thyroid panel.

## 2014-08-19 NOTE — Patient Instructions (Signed)
Anticoagulation Dose Instructions as of 08/19/2014      Glynis SmilesSun Mon Tue Wed Thu Fri Sat   New Dose 2 mg 4 mg 4 mg 4 mg 2 mg 4 mg 2 mg    Description        Continue warfarin dose of 1/2 tablet (= 2mg ) on sundays, thursdays and saturdays only.  Take 1 tablet (= 4mg ) all other days.       INR was 2.5 today

## 2014-08-19 NOTE — Telephone Encounter (Signed)
Last filled 06/11/14, last seen 07/23/14. Call into [facility]

## 2014-08-20 DIAGNOSIS — Z79899 Other long term (current) drug therapy: Secondary | ICD-10-CM | POA: Diagnosis not present

## 2014-08-20 DIAGNOSIS — Z87891 Personal history of nicotine dependence: Secondary | ICD-10-CM | POA: Diagnosis not present

## 2014-08-20 DIAGNOSIS — J984 Other disorders of lung: Secondary | ICD-10-CM | POA: Diagnosis not present

## 2014-08-20 DIAGNOSIS — Z7901 Long term (current) use of anticoagulants: Secondary | ICD-10-CM | POA: Diagnosis not present

## 2014-08-20 DIAGNOSIS — I517 Cardiomegaly: Secondary | ICD-10-CM | POA: Diagnosis not present

## 2014-08-20 DIAGNOSIS — R200 Anesthesia of skin: Secondary | ICD-10-CM | POA: Diagnosis not present

## 2014-08-20 DIAGNOSIS — R000 Tachycardia, unspecified: Secondary | ICD-10-CM | POA: Diagnosis not present

## 2014-08-20 DIAGNOSIS — R531 Weakness: Secondary | ICD-10-CM | POA: Diagnosis not present

## 2014-08-20 DIAGNOSIS — I10 Essential (primary) hypertension: Secondary | ICD-10-CM | POA: Diagnosis not present

## 2014-08-20 DIAGNOSIS — E039 Hypothyroidism, unspecified: Secondary | ICD-10-CM | POA: Diagnosis not present

## 2014-08-20 DIAGNOSIS — E119 Type 2 diabetes mellitus without complications: Secondary | ICD-10-CM | POA: Diagnosis not present

## 2014-08-20 DIAGNOSIS — R918 Other nonspecific abnormal finding of lung field: Secondary | ICD-10-CM | POA: Diagnosis not present

## 2014-08-20 LAB — BMP8+EGFR
BUN/Creatinine Ratio: 20 (ref 11–26)
BUN: 21 mg/dL (ref 8–27)
CO2: 27 mmol/L (ref 18–29)
CREATININE: 1.06 mg/dL — AB (ref 0.57–1.00)
Calcium: 9.5 mg/dL (ref 8.7–10.3)
Chloride: 103 mmol/L (ref 97–108)
GFR calc Af Amer: 58 mL/min/{1.73_m2} — ABNORMAL LOW (ref >59)
GFR calc non Af Amer: 50 mL/min/{1.73_m2} — ABNORMAL LOW (ref >59)
Glucose: 84 mg/dL (ref 65–99)
Potassium: 4.4 mmol/L (ref 3.5–5.2)
Sodium: 146 mmol/L — ABNORMAL HIGH (ref 134–144)

## 2014-08-20 LAB — THYROID PANEL WITH TSH
FREE THYROXINE INDEX: 2.1 (ref 1.2–4.9)
T3 UPTAKE RATIO: 31 % (ref 24–39)
T4 TOTAL: 6.8 ug/dL (ref 4.5–12.0)
TSH: 10.79 u[IU]/mL — AB (ref 0.450–4.500)

## 2014-08-20 MED ORDER — ALPRAZOLAM 0.5 MG PO TABS: 0.5000 mg | ORAL_TABLET | Freq: Every day | ORAL | Status: DC

## 2014-08-20 NOTE — Telephone Encounter (Signed)
Called into pharmacy

## 2014-08-21 ENCOUNTER — Telehealth: Payer: Self-pay | Admitting: Pharmacist

## 2014-08-21 DIAGNOSIS — R531 Weakness: Secondary | ICD-10-CM | POA: Diagnosis not present

## 2014-08-21 DIAGNOSIS — R319 Hematuria, unspecified: Secondary | ICD-10-CM | POA: Diagnosis not present

## 2014-08-21 DIAGNOSIS — F419 Anxiety disorder, unspecified: Secondary | ICD-10-CM | POA: Diagnosis not present

## 2014-08-21 DIAGNOSIS — W010XXS Fall on same level from slipping, tripping and stumbling without subsequent striking against object, sequela: Secondary | ICD-10-CM | POA: Diagnosis not present

## 2014-08-21 DIAGNOSIS — E039 Hypothyroidism, unspecified: Secondary | ICD-10-CM | POA: Diagnosis not present

## 2014-08-21 DIAGNOSIS — I471 Supraventricular tachycardia: Secondary | ICD-10-CM | POA: Diagnosis not present

## 2014-08-21 DIAGNOSIS — I4581 Long QT syndrome: Secondary | ICD-10-CM | POA: Diagnosis not present

## 2014-08-21 DIAGNOSIS — Z7901 Long term (current) use of anticoagulants: Secondary | ICD-10-CM | POA: Diagnosis not present

## 2014-08-21 DIAGNOSIS — R110 Nausea: Secondary | ICD-10-CM | POA: Diagnosis not present

## 2014-08-21 DIAGNOSIS — Z7982 Long term (current) use of aspirin: Secondary | ICD-10-CM | POA: Diagnosis not present

## 2014-08-21 DIAGNOSIS — I480 Paroxysmal atrial fibrillation: Secondary | ICD-10-CM | POA: Diagnosis not present

## 2014-08-21 DIAGNOSIS — J90 Pleural effusion, not elsewhere classified: Secondary | ICD-10-CM | POA: Diagnosis not present

## 2014-08-21 DIAGNOSIS — R627 Adult failure to thrive: Secondary | ICD-10-CM | POA: Diagnosis not present

## 2014-08-21 DIAGNOSIS — R002 Palpitations: Secondary | ICD-10-CM | POA: Diagnosis not present

## 2014-08-21 DIAGNOSIS — E119 Type 2 diabetes mellitus without complications: Secondary | ICD-10-CM | POA: Diagnosis not present

## 2014-08-21 DIAGNOSIS — R918 Other nonspecific abnormal finding of lung field: Secondary | ICD-10-CM | POA: Diagnosis not present

## 2014-08-21 DIAGNOSIS — M797 Fibromyalgia: Secondary | ICD-10-CM | POA: Diagnosis not present

## 2014-08-21 DIAGNOSIS — J810 Acute pulmonary edema: Secondary | ICD-10-CM | POA: Diagnosis not present

## 2014-08-21 DIAGNOSIS — J811 Chronic pulmonary edema: Secondary | ICD-10-CM | POA: Diagnosis not present

## 2014-08-21 DIAGNOSIS — N390 Urinary tract infection, site not specified: Secondary | ICD-10-CM | POA: Diagnosis not present

## 2014-08-21 DIAGNOSIS — R000 Tachycardia, unspecified: Secondary | ICD-10-CM | POA: Diagnosis not present

## 2014-08-21 DIAGNOSIS — I472 Ventricular tachycardia: Secondary | ICD-10-CM | POA: Diagnosis not present

## 2014-08-21 DIAGNOSIS — E038 Other specified hypothyroidism: Secondary | ICD-10-CM | POA: Diagnosis not present

## 2014-08-21 DIAGNOSIS — R001 Bradycardia, unspecified: Secondary | ICD-10-CM | POA: Diagnosis not present

## 2014-08-21 DIAGNOSIS — R5383 Other fatigue: Secondary | ICD-10-CM | POA: Diagnosis not present

## 2014-08-21 DIAGNOSIS — Z79899 Other long term (current) drug therapy: Secondary | ICD-10-CM | POA: Diagnosis not present

## 2014-08-21 DIAGNOSIS — Z9889 Other specified postprocedural states: Secondary | ICD-10-CM | POA: Diagnosis not present

## 2014-08-21 DIAGNOSIS — F339 Major depressive disorder, recurrent, unspecified: Secondary | ICD-10-CM | POA: Diagnosis not present

## 2014-08-21 DIAGNOSIS — E785 Hyperlipidemia, unspecified: Secondary | ICD-10-CM | POA: Diagnosis not present

## 2014-08-21 DIAGNOSIS — I4891 Unspecified atrial fibrillation: Secondary | ICD-10-CM | POA: Diagnosis not present

## 2014-08-21 DIAGNOSIS — I440 Atrioventricular block, first degree: Secondary | ICD-10-CM | POA: Diagnosis not present

## 2014-08-21 DIAGNOSIS — I491 Atrial premature depolarization: Secondary | ICD-10-CM | POA: Diagnosis not present

## 2014-08-21 DIAGNOSIS — R9431 Abnormal electrocardiogram [ECG] [EKG]: Secondary | ICD-10-CM | POA: Diagnosis not present

## 2014-08-21 DIAGNOSIS — F329 Major depressive disorder, single episode, unspecified: Secondary | ICD-10-CM | POA: Diagnosis not present

## 2014-08-21 DIAGNOSIS — Z7401 Bed confinement status: Secondary | ICD-10-CM | POA: Diagnosis not present

## 2014-08-21 DIAGNOSIS — I10 Essential (primary) hypertension: Secondary | ICD-10-CM | POA: Diagnosis not present

## 2014-08-21 DIAGNOSIS — I050 Rheumatic mitral stenosis: Secondary | ICD-10-CM | POA: Diagnosis not present

## 2014-08-21 DIAGNOSIS — I454 Nonspecific intraventricular block: Secondary | ICD-10-CM | POA: Diagnosis not present

## 2014-08-21 DIAGNOSIS — Z87891 Personal history of nicotine dependence: Secondary | ICD-10-CM | POA: Diagnosis not present

## 2014-08-21 DIAGNOSIS — Z880 Allergy status to penicillin: Secondary | ICD-10-CM | POA: Diagnosis not present

## 2014-08-21 DIAGNOSIS — R200 Anesthesia of skin: Secondary | ICD-10-CM | POA: Diagnosis not present

## 2014-08-21 DIAGNOSIS — I509 Heart failure, unspecified: Secondary | ICD-10-CM | POA: Diagnosis not present

## 2014-08-21 MED ORDER — LEVOTHYROXINE SODIUM 75 MCG PO TABS: 75.0000 ug | ORAL_TABLET | Freq: Every day | ORAL | Status: AC

## 2014-08-21 NOTE — Telephone Encounter (Signed)
Tried to call patient - she was taken to hospital last night with acute CHF.  I spoke with her grandson and he gave me number for his mother / Samantha Compton's daughter.  Spoke with her daughter and let her know that TSH was elevated and thyroid medication needed to be increased.  She is also to let the nurse and physician at [facility] know to review our labs.

## 2014-08-24 DIAGNOSIS — I480 Paroxysmal atrial fibrillation: Secondary | ICD-10-CM | POA: Diagnosis not present

## 2014-08-24 DIAGNOSIS — I4891 Unspecified atrial fibrillation: Secondary | ICD-10-CM | POA: Diagnosis not present

## 2014-08-24 DIAGNOSIS — I209 Angina pectoris, unspecified: Secondary | ICD-10-CM | POA: Diagnosis not present

## 2014-08-24 DIAGNOSIS — I440 Atrioventricular block, first degree: Secondary | ICD-10-CM | POA: Diagnosis not present

## 2014-08-24 DIAGNOSIS — W010XXS Fall on same level from slipping, tripping and stumbling without subsequent striking against object, sequela: Secondary | ICD-10-CM | POA: Diagnosis not present

## 2014-08-24 DIAGNOSIS — E039 Hypothyroidism, unspecified: Secondary | ICD-10-CM | POA: Diagnosis not present

## 2014-08-24 DIAGNOSIS — I4581 Long QT syndrome: Secondary | ICD-10-CM | POA: Diagnosis not present

## 2014-08-24 DIAGNOSIS — E038 Other specified hypothyroidism: Secondary | ICD-10-CM | POA: Diagnosis not present

## 2014-08-24 DIAGNOSIS — R5383 Other fatigue: Secondary | ICD-10-CM | POA: Diagnosis not present

## 2014-08-24 DIAGNOSIS — R9431 Abnormal electrocardiogram [ECG] [EKG]: Secondary | ICD-10-CM | POA: Diagnosis not present

## 2014-08-24 DIAGNOSIS — Z79899 Other long term (current) drug therapy: Secondary | ICD-10-CM | POA: Diagnosis not present

## 2014-08-24 DIAGNOSIS — I6789 Other cerebrovascular disease: Secondary | ICD-10-CM | POA: Diagnosis not present

## 2014-08-24 DIAGNOSIS — F339 Major depressive disorder, recurrent, unspecified: Secondary | ICD-10-CM | POA: Diagnosis not present

## 2014-08-24 DIAGNOSIS — E119 Type 2 diabetes mellitus without complications: Secondary | ICD-10-CM | POA: Diagnosis not present

## 2014-08-24 DIAGNOSIS — R200 Anesthesia of skin: Secondary | ICD-10-CM | POA: Diagnosis not present

## 2014-08-24 DIAGNOSIS — I10 Essential (primary) hypertension: Secondary | ICD-10-CM | POA: Diagnosis not present

## 2014-08-24 DIAGNOSIS — I471 Supraventricular tachycardia: Secondary | ICD-10-CM | POA: Diagnosis not present

## 2014-08-24 DIAGNOSIS — Z8249 Family history of ischemic heart disease and other diseases of the circulatory system: Secondary | ICD-10-CM | POA: Diagnosis not present

## 2014-08-24 DIAGNOSIS — K922 Gastrointestinal hemorrhage, unspecified: Secondary | ICD-10-CM | POA: Diagnosis not present

## 2014-08-24 DIAGNOSIS — Z7901 Long term (current) use of anticoagulants: Secondary | ICD-10-CM | POA: Diagnosis not present

## 2014-08-24 DIAGNOSIS — R0789 Other chest pain: Secondary | ICD-10-CM | POA: Diagnosis not present

## 2014-08-24 DIAGNOSIS — I472 Ventricular tachycardia: Secondary | ICD-10-CM | POA: Diagnosis not present

## 2014-08-24 DIAGNOSIS — R531 Weakness: Secondary | ICD-10-CM | POA: Diagnosis not present

## 2014-08-24 DIAGNOSIS — R001 Bradycardia, unspecified: Secondary | ICD-10-CM | POA: Diagnosis not present

## 2014-08-24 DIAGNOSIS — Z7401 Bed confinement status: Secondary | ICD-10-CM | POA: Diagnosis not present

## 2014-08-24 DIAGNOSIS — Z9889 Other specified postprocedural states: Secondary | ICD-10-CM | POA: Diagnosis not present

## 2014-08-24 DIAGNOSIS — I214 Non-ST elevation (NSTEMI) myocardial infarction: Secondary | ICD-10-CM | POA: Diagnosis not present

## 2014-08-24 DIAGNOSIS — J9811 Atelectasis: Secondary | ICD-10-CM | POA: Diagnosis not present

## 2014-08-27 DIAGNOSIS — Z7901 Long term (current) use of anticoagulants: Secondary | ICD-10-CM | POA: Diagnosis not present

## 2014-08-27 DIAGNOSIS — Z8249 Family history of ischemic heart disease and other diseases of the circulatory system: Secondary | ICD-10-CM | POA: Diagnosis not present

## 2014-08-27 DIAGNOSIS — E039 Hypothyroidism, unspecified: Secondary | ICD-10-CM | POA: Diagnosis not present

## 2014-08-27 DIAGNOSIS — R0789 Other chest pain: Secondary | ICD-10-CM | POA: Diagnosis not present

## 2014-08-27 DIAGNOSIS — I10 Essential (primary) hypertension: Secondary | ICD-10-CM | POA: Diagnosis not present

## 2014-08-27 DIAGNOSIS — K922 Gastrointestinal hemorrhage, unspecified: Secondary | ICD-10-CM | POA: Diagnosis not present

## 2014-08-27 DIAGNOSIS — I209 Angina pectoris, unspecified: Secondary | ICD-10-CM | POA: Diagnosis not present

## 2014-08-27 DIAGNOSIS — E119 Type 2 diabetes mellitus without complications: Secondary | ICD-10-CM | POA: Diagnosis not present

## 2014-08-27 DIAGNOSIS — Z79899 Other long term (current) drug therapy: Secondary | ICD-10-CM | POA: Diagnosis not present

## 2014-08-27 DIAGNOSIS — I214 Non-ST elevation (NSTEMI) myocardial infarction: Secondary | ICD-10-CM | POA: Diagnosis not present

## 2014-08-27 DIAGNOSIS — J9811 Atelectasis: Secondary | ICD-10-CM | POA: Diagnosis not present

## 2014-08-27 DIAGNOSIS — I4891 Unspecified atrial fibrillation: Secondary | ICD-10-CM | POA: Diagnosis not present

## 2014-08-28 DIAGNOSIS — Z808 Family history of malignant neoplasm of other organs or systems: Secondary | ICD-10-CM | POA: Diagnosis not present

## 2014-08-28 DIAGNOSIS — Z9989 Dependence on other enabling machines and devices: Secondary | ICD-10-CM | POA: Diagnosis not present

## 2014-08-28 DIAGNOSIS — R110 Nausea: Secondary | ICD-10-CM | POA: Diagnosis not present

## 2014-08-28 DIAGNOSIS — I480 Paroxysmal atrial fibrillation: Secondary | ICD-10-CM | POA: Diagnosis not present

## 2014-08-28 DIAGNOSIS — E039 Hypothyroidism, unspecified: Secondary | ICD-10-CM | POA: Diagnosis not present

## 2014-08-28 DIAGNOSIS — Z7901 Long term (current) use of anticoagulants: Secondary | ICD-10-CM | POA: Diagnosis not present

## 2014-08-28 DIAGNOSIS — R109 Unspecified abdominal pain: Secondary | ICD-10-CM | POA: Diagnosis not present

## 2014-08-28 DIAGNOSIS — Z87891 Personal history of nicotine dependence: Secondary | ICD-10-CM | POA: Diagnosis not present

## 2014-08-28 DIAGNOSIS — D509 Iron deficiency anemia, unspecified: Secondary | ICD-10-CM | POA: Diagnosis not present

## 2014-08-28 DIAGNOSIS — K5900 Constipation, unspecified: Secondary | ICD-10-CM | POA: Diagnosis not present

## 2014-08-28 DIAGNOSIS — Z9981 Dependence on supplemental oxygen: Secondary | ICD-10-CM | POA: Diagnosis not present

## 2014-08-28 DIAGNOSIS — J90 Pleural effusion, not elsewhere classified: Secondary | ICD-10-CM | POA: Diagnosis not present

## 2014-08-28 DIAGNOSIS — E538 Deficiency of other specified B group vitamins: Secondary | ICD-10-CM | POA: Diagnosis not present

## 2014-08-28 DIAGNOSIS — Z7982 Long term (current) use of aspirin: Secondary | ICD-10-CM | POA: Diagnosis not present

## 2014-08-28 DIAGNOSIS — R079 Chest pain, unspecified: Secondary | ICD-10-CM | POA: Diagnosis not present

## 2014-08-28 DIAGNOSIS — E119 Type 2 diabetes mellitus without complications: Secondary | ICD-10-CM | POA: Diagnosis not present

## 2014-08-28 DIAGNOSIS — I10 Essential (primary) hypertension: Secondary | ICD-10-CM | POA: Diagnosis not present

## 2014-08-29 DIAGNOSIS — I10 Essential (primary) hypertension: Secondary | ICD-10-CM | POA: Diagnosis not present

## 2014-08-29 DIAGNOSIS — I480 Paroxysmal atrial fibrillation: Secondary | ICD-10-CM | POA: Diagnosis not present

## 2014-08-29 DIAGNOSIS — K5900 Constipation, unspecified: Secondary | ICD-10-CM | POA: Diagnosis not present

## 2014-08-29 DIAGNOSIS — R110 Nausea: Secondary | ICD-10-CM | POA: Diagnosis not present

## 2014-08-29 DIAGNOSIS — W010XXS Fall on same level from slipping, tripping and stumbling without subsequent striking against object, sequela: Secondary | ICD-10-CM | POA: Diagnosis not present

## 2014-08-29 DIAGNOSIS — J90 Pleural effusion, not elsewhere classified: Secondary | ICD-10-CM | POA: Diagnosis not present

## 2014-08-29 DIAGNOSIS — E039 Hypothyroidism, unspecified: Secondary | ICD-10-CM | POA: Diagnosis not present

## 2014-08-29 DIAGNOSIS — R079 Chest pain, unspecified: Secondary | ICD-10-CM | POA: Diagnosis not present

## 2014-08-29 DIAGNOSIS — M6281 Muscle weakness (generalized): Secondary | ICD-10-CM | POA: Diagnosis not present

## 2014-08-29 DIAGNOSIS — D519 Vitamin B12 deficiency anemia, unspecified: Secondary | ICD-10-CM | POA: Diagnosis not present

## 2014-08-29 DIAGNOSIS — Z808 Family history of malignant neoplasm of other organs or systems: Secondary | ICD-10-CM | POA: Diagnosis not present

## 2014-08-29 DIAGNOSIS — E038 Other specified hypothyroidism: Secondary | ICD-10-CM | POA: Diagnosis not present

## 2014-08-29 DIAGNOSIS — Z7901 Long term (current) use of anticoagulants: Secondary | ICD-10-CM | POA: Diagnosis not present

## 2014-08-29 DIAGNOSIS — R5383 Other fatigue: Secondary | ICD-10-CM | POA: Diagnosis not present

## 2014-08-29 DIAGNOSIS — E109 Type 1 diabetes mellitus without complications: Secondary | ICD-10-CM | POA: Diagnosis not present

## 2014-08-29 DIAGNOSIS — E119 Type 2 diabetes mellitus without complications: Secondary | ICD-10-CM | POA: Diagnosis not present

## 2014-08-29 DIAGNOSIS — Z87891 Personal history of nicotine dependence: Secondary | ICD-10-CM | POA: Diagnosis not present

## 2014-08-29 DIAGNOSIS — E538 Deficiency of other specified B group vitamins: Secondary | ICD-10-CM | POA: Diagnosis not present

## 2014-08-29 DIAGNOSIS — D508 Other iron deficiency anemias: Secondary | ICD-10-CM | POA: Diagnosis not present

## 2014-08-29 DIAGNOSIS — Z7982 Long term (current) use of aspirin: Secondary | ICD-10-CM | POA: Diagnosis not present

## 2014-08-29 DIAGNOSIS — F339 Major depressive disorder, recurrent, unspecified: Secondary | ICD-10-CM | POA: Diagnosis not present

## 2014-08-29 DIAGNOSIS — I471 Supraventricular tachycardia: Secondary | ICD-10-CM | POA: Diagnosis not present

## 2014-08-29 DIAGNOSIS — D509 Iron deficiency anemia, unspecified: Secondary | ICD-10-CM | POA: Diagnosis not present

## 2014-09-07 DIAGNOSIS — F339 Major depressive disorder, recurrent, unspecified: Secondary | ICD-10-CM | POA: Diagnosis not present

## 2014-09-07 DIAGNOSIS — I480 Paroxysmal atrial fibrillation: Secondary | ICD-10-CM | POA: Diagnosis not present

## 2014-09-07 DIAGNOSIS — E109 Type 1 diabetes mellitus without complications: Secondary | ICD-10-CM | POA: Diagnosis not present

## 2014-09-07 DIAGNOSIS — I10 Essential (primary) hypertension: Secondary | ICD-10-CM | POA: Diagnosis not present

## 2014-09-13 ENCOUNTER — Other Ambulatory Visit: Payer: Self-pay | Admitting: *Deleted

## 2014-09-13 NOTE — Telephone Encounter (Signed)
Patient is being discharged from [facility] today. It was a last minute thing according to the Child psychotherapist. She is out of days according to her insurance and she must be sent home. Per facility they do not send meds home with patient so she would need some meds sent to pharmacy until she can get in here to office for a follow up appt. [facility] in 6 [facility] meds [facility] that patient started taking while in facility. Uloric, Isosorbide, levothyroxine 125, metoprolol, zoloft, and terazosin. Patients family notified

## 2014-09-17 ENCOUNTER — Encounter: Payer: Self-pay | Admitting: Family Medicine

## 2014-09-17 ENCOUNTER — Ambulatory Visit (INDEPENDENT_AMBULATORY_CARE_PROVIDER_SITE_OTHER): Payer: Medicare Other | Admitting: Family Medicine

## 2014-09-17 VITALS — BP 140/68 | HR 79 | Temp 97.3°F | Ht 65.0 in | Wt 156.0 lb

## 2014-09-17 DIAGNOSIS — E118 Type 2 diabetes mellitus with unspecified complications: Secondary | ICD-10-CM

## 2014-09-17 DIAGNOSIS — I10 Essential (primary) hypertension: Secondary | ICD-10-CM | POA: Diagnosis not present

## 2014-09-17 DIAGNOSIS — E039 Hypothyroidism, unspecified: Secondary | ICD-10-CM | POA: Diagnosis not present

## 2014-09-17 DIAGNOSIS — E785 Hyperlipidemia, unspecified: Secondary | ICD-10-CM | POA: Diagnosis not present

## 2014-09-17 MED ORDER — ALPRAZOLAM 0.5 MG PO TABS: 0.5000 mg | ORAL_TABLET | Freq: Every day | ORAL | Status: AC

## 2014-09-17 NOTE — Progress Notes (Signed)
Subjective:    Patient ID: Samantha Compton, female    DOB: 22-Apr-1933, 81 y.o.   MRN: [id number]  HPI [age]ine[age]-year-old female who is seen today as follow-up discharge from skilled nursing facility. She had been hospitalized at [facility] in [place] for chest pain then transferred to [facility]. Apparently she had cardiac catheterization. She was discharged to nursing home and wore a event monitor for 10 days. She does not know the results of that monitor. They did start Imdur, long-acting nitrate 1 she was at [facility].  She experienced general weakness post discharge both in her hands and legs and had physical therapy at skilled nursing.  Patient Active Problem List   Diagnosis Date Noted  . Anxiety, generalized 04/23/2013  . Alopecia 04/23/2013  . Hypothyroidism 04/02/2013  . Dyspnea 12/12/2012  . Pedal edema 12/12/2012  . HTN (hypertension) 07/06/2012  . HLD (hyperlipidemia) 07/06/2012  . DM (diabetes mellitus) 07/06/2012  . Gout 07/06/2012  . Myalgia 07/06/2012  . Afib 06/22/2012   Outpatient Encounter Prescriptions as of 09/17/2014  Medication Sig  . allopurinol (ZYLOPRIM) 100 MG tablet TAKE 2 TABLETS (200 MG TOTAL) BY MOUTH DAILY.  Marland Kitchen ALPRAZolam (XANAX) 0.5 MG tablet Take 1 tablet (0.5 mg total) by mouth at bedtime.  Marland Kitchen aspirin EC 81 MG tablet Take 81 mg by mouth daily.  . chlorthalidone (HYGROTON) 25 MG tablet Take 1 tablet by mouth daily.  . ferrous sulfate 325 (65 FE) MG tablet Take 325 mg by mouth daily with breakfast.  . glimepiride (AMARYL) 4 MG tablet Take 1 tablet (4 mg total) by mouth daily before breakfast.  . isosorbide mononitrate (IMDUR) 30 MG 24 hr tablet Take 1 tablet by mouth daily.  Marland Kitchen levothyroxine (SYNTHROID, LEVOTHROID) 75 MCG tablet Take 1 tablet (75 mcg total) by mouth daily before breakfast.  . lisinopril (PRINIVIL,ZESTRIL) 40 MG tablet Take 1 tablet (40 mg total) by mouth daily.  . metFORMIN (GLUCOPHAGE XR) 500 MG 24 hr tablet Take 1 tablet (500 mg total) by  mouth daily with breakfast.  . metoprolol tartrate (LOPRESSOR) 25 MG tablet Take 1 tablet by mouth 2 (two) times daily.  . sertraline (ZOLOFT) 25 MG tablet Take 1 tablet by mouth daily.  Marland Kitchen terazosin (HYTRIN) 5 MG capsule Take 1 capsule by mouth daily.  . vitamin B-12 (CYANOCOBALAMIN) 1000 MCG tablet Take 1,000 mcg by mouth daily.  Marland Kitchen warfarin (COUMADIN) 4 MG tablet TAKE 1 TABLET DAILY OR PER COUMADIN CLINIC (Patient taking differently: TAKE 4MG  ON MONDAYS THROUGH WEDNESDAYS, THEN TAKE  ONE HALF TABLET (2MG )  ON THURSDAYS, THEN TAKE 4MG  ON FRIDAY, THEN TAKE ONE-HALF TABLET ON SATURDAYS AND SUNDAYS)  . furosemide (LASIX) 20 MG tablet Take on Monday and Thursday and leave off chlorthalidone on those days (Patient not taking: Reported on 09/17/2014)  . gabapentin (NEURONTIN) 100 MG capsule 1 tablet at bedtime for 3 days. Inc to 2 tablets at bedtime for 3 days if no better. If no better can increase to 3 tablets. (Patient not taking: Reported on 09/17/2014)  . potassium chloride SA (KLOR-CON M20) 20 MEQ tablet Take 1 tablet (20 mEq total) by mouth daily. (Patient not taking: Reported on 09/17/2014)  . [DISCONTINUED] chlorthalidone (HYGROTON) 25 MG tablet TAKE 1 TABLET (25 MG TOTAL) BY MOUTH EVERY MORNING.  . [DISCONTINUED] cloNIDine (CATAPRES) 0.1 MG tablet Take 1 tablet (0.1 mg total) by mouth 2 (two) times daily before a meal.  . [DISCONTINUED] cloNIDine (CATAPRES) 0.1 MG tablet TAKE 1 TABLET (0.1 MG TOTAL) BY MOUTH  2 (TWO) TIMES DAILY BEFORE A MEAL.  . [DISCONTINUED] flecainide (TAMBOCOR) 100 MG tablet Take 100 mg by mouth 2 (two) times daily.  . [DISCONTINUED] meclizine (ANTIVERT) 12.5 MG tablet Take 1 tablet (12.5 mg total) by mouth 3 (three) times daily as needed for dizziness. (Patient not taking: Reported on 08/19/2014)  . [DISCONTINUED] Nebivolol HCl 20 MG TABS Take 2 tablets (40 mg total) by mouth daily.   No facility-administered encounter medications on file as of 09/17/2014.      Review of  Systems  Constitutional: Positive for activity change and fatigue.  HENT: Negative.   Respiratory: Negative.   Cardiovascular: Negative.   Genitourinary: Negative.   Neurological: Negative.   Psychiatric/Behavioral: Negative.        Objective:   Physical Exam  Constitutional: She is oriented to person, place, and time. She appears well-developed and well-nourished.  Cardiovascular: Normal rate, regular rhythm and normal heart sounds.   Pulmonary/Chest: Effort normal and breath sounds normal.  Musculoskeletal: She exhibits edema.  Neurological: She is alert and oriented to person, place, and time.  Psychiatric: She has a normal mood and affect. Her behavior is normal.    BP 140/68 mmHg  Pulse 79  Temp(Src) 97.3 F (36.3 C) (Oral)  Ht  (1.651 m)  Wt 156 lb (70.761 kg)  BMI 25.96 kg/m2        Assessment & Plan:  1. Hypothyroidism, unspecified hypothyroidism type Dosage of thyroid replacement has been recently changed per TSH results  2. Essential hypertension Blood pressure is normal today on clonidine and chlorthalidone although she has not had diaphoretic in the last few days. She also takes nebivolol for blood pressure and the Imdur is certainly helping their too.  She does have 2+ edema and I recommended Lasix daily until back to baseline and continue with chlorthalidone use of Lasix as a when necessary medicine  3. HLD (hyperlipidemia) I do not see that she is on lipid lowering medicine  4. Type 2 diabetes mellitus with complication She takes only metformin and Amaryl for diabetes. Last A1c one month ago was 5.4

## 2014-09-19 ENCOUNTER — Ambulatory Visit: Payer: Self-pay

## 2014-09-19 DIAGNOSIS — D513 Other dietary vitamin B12 deficiency anemia: Secondary | ICD-10-CM | POA: Diagnosis not present

## 2014-09-19 DIAGNOSIS — F339 Major depressive disorder, recurrent, unspecified: Secondary | ICD-10-CM | POA: Diagnosis not present

## 2014-09-19 DIAGNOSIS — I10 Essential (primary) hypertension: Secondary | ICD-10-CM | POA: Diagnosis not present

## 2014-09-19 DIAGNOSIS — I471 Supraventricular tachycardia: Secondary | ICD-10-CM | POA: Diagnosis not present

## 2014-09-19 DIAGNOSIS — W010XXS Fall on same level from slipping, tripping and stumbling without subsequent striking against object, sequela: Secondary | ICD-10-CM | POA: Diagnosis not present

## 2014-09-19 DIAGNOSIS — E038 Other specified hypothyroidism: Secondary | ICD-10-CM | POA: Diagnosis not present

## 2014-09-19 DIAGNOSIS — E109 Type 1 diabetes mellitus without complications: Secondary | ICD-10-CM | POA: Diagnosis not present

## 2014-09-19 DIAGNOSIS — I480 Paroxysmal atrial fibrillation: Secondary | ICD-10-CM | POA: Diagnosis not present

## 2014-09-23 DIAGNOSIS — W010XXS Fall on same level from slipping, tripping and stumbling without subsequent striking against object, sequela: Secondary | ICD-10-CM | POA: Diagnosis not present

## 2014-09-23 DIAGNOSIS — I471 Supraventricular tachycardia: Secondary | ICD-10-CM | POA: Diagnosis not present

## 2014-09-23 DIAGNOSIS — F339 Major depressive disorder, recurrent, unspecified: Secondary | ICD-10-CM | POA: Diagnosis not present

## 2014-09-23 DIAGNOSIS — D513 Other dietary vitamin B12 deficiency anemia: Secondary | ICD-10-CM | POA: Diagnosis not present

## 2014-09-23 DIAGNOSIS — I10 Essential (primary) hypertension: Secondary | ICD-10-CM | POA: Diagnosis not present

## 2014-09-23 DIAGNOSIS — E038 Other specified hypothyroidism: Secondary | ICD-10-CM | POA: Diagnosis not present

## 2014-09-23 DIAGNOSIS — I480 Paroxysmal atrial fibrillation: Secondary | ICD-10-CM | POA: Diagnosis not present

## 2014-09-23 DIAGNOSIS — E109 Type 1 diabetes mellitus without complications: Secondary | ICD-10-CM | POA: Diagnosis not present

## 2014-09-24 DIAGNOSIS — E109 Type 1 diabetes mellitus without complications: Secondary | ICD-10-CM | POA: Diagnosis not present

## 2014-09-24 DIAGNOSIS — E038 Other specified hypothyroidism: Secondary | ICD-10-CM | POA: Diagnosis not present

## 2014-09-24 DIAGNOSIS — I10 Essential (primary) hypertension: Secondary | ICD-10-CM | POA: Diagnosis not present

## 2014-09-24 DIAGNOSIS — D513 Other dietary vitamin B12 deficiency anemia: Secondary | ICD-10-CM | POA: Diagnosis not present

## 2014-09-24 DIAGNOSIS — F339 Major depressive disorder, recurrent, unspecified: Secondary | ICD-10-CM | POA: Diagnosis not present

## 2014-09-24 DIAGNOSIS — I471 Supraventricular tachycardia: Secondary | ICD-10-CM | POA: Diagnosis not present

## 2014-09-24 DIAGNOSIS — I480 Paroxysmal atrial fibrillation: Secondary | ICD-10-CM | POA: Diagnosis not present

## 2014-09-24 DIAGNOSIS — W010XXS Fall on same level from slipping, tripping and stumbling without subsequent striking against object, sequela: Secondary | ICD-10-CM | POA: Diagnosis not present

## 2014-09-25 ENCOUNTER — Telehealth: Payer: Self-pay | Admitting: *Deleted

## 2014-09-25 NOTE — Telephone Encounter (Signed)
Pt needs Occ therapy twice a week x 8 wks for home safety and ADLs for generalized extremity weakness Verbal order given

## 2014-09-27 DIAGNOSIS — D513 Other dietary vitamin B12 deficiency anemia: Secondary | ICD-10-CM | POA: Diagnosis not present

## 2014-09-27 DIAGNOSIS — I10 Essential (primary) hypertension: Secondary | ICD-10-CM | POA: Diagnosis not present

## 2014-09-27 DIAGNOSIS — W010XXS Fall on same level from slipping, tripping and stumbling without subsequent striking against object, sequela: Secondary | ICD-10-CM | POA: Diagnosis not present

## 2014-09-27 DIAGNOSIS — I471 Supraventricular tachycardia: Secondary | ICD-10-CM | POA: Diagnosis not present

## 2014-09-27 DIAGNOSIS — F339 Major depressive disorder, recurrent, unspecified: Secondary | ICD-10-CM | POA: Diagnosis not present

## 2014-09-27 DIAGNOSIS — I480 Paroxysmal atrial fibrillation: Secondary | ICD-10-CM | POA: Diagnosis not present

## 2014-09-27 DIAGNOSIS — E109 Type 1 diabetes mellitus without complications: Secondary | ICD-10-CM | POA: Diagnosis not present

## 2014-09-27 DIAGNOSIS — E038 Other specified hypothyroidism: Secondary | ICD-10-CM | POA: Diagnosis not present

## 2014-10-01 DIAGNOSIS — J90 Pleural effusion, not elsewhere classified: Secondary | ICD-10-CM | POA: Diagnosis not present

## 2014-10-01 DIAGNOSIS — I480 Paroxysmal atrial fibrillation: Secondary | ICD-10-CM | POA: Diagnosis not present

## 2014-10-01 DIAGNOSIS — F339 Major depressive disorder, recurrent, unspecified: Secondary | ICD-10-CM | POA: Diagnosis not present

## 2014-10-01 DIAGNOSIS — Z8249 Family history of ischemic heart disease and other diseases of the circulatory system: Secondary | ICD-10-CM | POA: Diagnosis not present

## 2014-10-01 DIAGNOSIS — I482 Chronic atrial fibrillation: Secondary | ICD-10-CM | POA: Diagnosis not present

## 2014-10-01 DIAGNOSIS — Z7982 Long term (current) use of aspirin: Secondary | ICD-10-CM | POA: Diagnosis not present

## 2014-10-01 DIAGNOSIS — I10 Essential (primary) hypertension: Secondary | ICD-10-CM | POA: Diagnosis not present

## 2014-10-01 DIAGNOSIS — M109 Gout, unspecified: Secondary | ICD-10-CM | POA: Diagnosis not present

## 2014-10-01 DIAGNOSIS — Z800 Family history of malignant neoplasm of digestive organs: Secondary | ICD-10-CM | POA: Diagnosis not present

## 2014-10-01 DIAGNOSIS — I2510 Atherosclerotic heart disease of native coronary artery without angina pectoris: Secondary | ICD-10-CM | POA: Diagnosis not present

## 2014-10-01 DIAGNOSIS — Z7901 Long term (current) use of anticoagulants: Secondary | ICD-10-CM | POA: Diagnosis not present

## 2014-10-01 DIAGNOSIS — I517 Cardiomegaly: Secondary | ICD-10-CM | POA: Diagnosis not present

## 2014-10-01 DIAGNOSIS — I509 Heart failure, unspecified: Secondary | ICD-10-CM | POA: Diagnosis not present

## 2014-10-01 DIAGNOSIS — I050 Rheumatic mitral stenosis: Secondary | ICD-10-CM | POA: Diagnosis not present

## 2014-10-01 DIAGNOSIS — J189 Pneumonia, unspecified organism: Secondary | ICD-10-CM | POA: Diagnosis not present

## 2014-10-01 DIAGNOSIS — Z87891 Personal history of nicotine dependence: Secondary | ICD-10-CM | POA: Diagnosis not present

## 2014-10-01 DIAGNOSIS — I471 Supraventricular tachycardia: Secondary | ICD-10-CM | POA: Diagnosis not present

## 2014-10-01 DIAGNOSIS — I4891 Unspecified atrial fibrillation: Secondary | ICD-10-CM | POA: Diagnosis not present

## 2014-10-01 DIAGNOSIS — I5040 Unspecified combined systolic (congestive) and diastolic (congestive) heart failure: Secondary | ICD-10-CM | POA: Diagnosis not present

## 2014-10-01 DIAGNOSIS — E109 Type 1 diabetes mellitus without complications: Secondary | ICD-10-CM | POA: Diagnosis not present

## 2014-10-01 DIAGNOSIS — R0682 Tachypnea, not elsewhere classified: Secondary | ICD-10-CM | POA: Diagnosis not present

## 2014-10-01 DIAGNOSIS — E038 Other specified hypothyroidism: Secondary | ICD-10-CM | POA: Diagnosis not present

## 2014-10-01 DIAGNOSIS — Z880 Allergy status to penicillin: Secondary | ICD-10-CM | POA: Diagnosis not present

## 2014-10-01 DIAGNOSIS — E039 Hypothyroidism, unspecified: Secondary | ICD-10-CM | POA: Diagnosis not present

## 2014-10-01 DIAGNOSIS — I5033 Acute on chronic diastolic (congestive) heart failure: Secondary | ICD-10-CM | POA: Diagnosis not present

## 2014-10-01 DIAGNOSIS — E119 Type 2 diabetes mellitus without complications: Secondary | ICD-10-CM | POA: Diagnosis not present

## 2014-10-01 DIAGNOSIS — R0602 Shortness of breath: Secondary | ICD-10-CM | POA: Diagnosis not present

## 2014-10-01 DIAGNOSIS — D513 Other dietary vitamin B12 deficiency anemia: Secondary | ICD-10-CM | POA: Diagnosis not present

## 2014-10-01 DIAGNOSIS — Z79899 Other long term (current) drug therapy: Secondary | ICD-10-CM | POA: Diagnosis not present

## 2014-10-01 DIAGNOSIS — W010XXS Fall on same level from slipping, tripping and stumbling without subsequent striking against object, sequela: Secondary | ICD-10-CM | POA: Diagnosis not present

## 2014-10-02 DIAGNOSIS — M109 Gout, unspecified: Secondary | ICD-10-CM | POA: Diagnosis not present

## 2014-10-02 DIAGNOSIS — I482 Chronic atrial fibrillation: Secondary | ICD-10-CM | POA: Diagnosis not present

## 2014-10-02 DIAGNOSIS — Z800 Family history of malignant neoplasm of digestive organs: Secondary | ICD-10-CM | POA: Diagnosis not present

## 2014-10-02 DIAGNOSIS — Z880 Allergy status to penicillin: Secondary | ICD-10-CM | POA: Diagnosis not present

## 2014-10-02 DIAGNOSIS — Z87891 Personal history of nicotine dependence: Secondary | ICD-10-CM | POA: Diagnosis not present

## 2014-10-02 DIAGNOSIS — Z79899 Other long term (current) drug therapy: Secondary | ICD-10-CM | POA: Diagnosis not present

## 2014-10-02 DIAGNOSIS — I050 Rheumatic mitral stenosis: Secondary | ICD-10-CM | POA: Diagnosis not present

## 2014-10-02 DIAGNOSIS — I10 Essential (primary) hypertension: Secondary | ICD-10-CM | POA: Diagnosis not present

## 2014-10-02 DIAGNOSIS — J189 Pneumonia, unspecified organism: Secondary | ICD-10-CM | POA: Diagnosis not present

## 2014-10-02 DIAGNOSIS — I5033 Acute on chronic diastolic (congestive) heart failure: Secondary | ICD-10-CM | POA: Diagnosis not present

## 2014-10-02 DIAGNOSIS — Z8249 Family history of ischemic heart disease and other diseases of the circulatory system: Secondary | ICD-10-CM | POA: Diagnosis not present

## 2014-10-02 DIAGNOSIS — E039 Hypothyroidism, unspecified: Secondary | ICD-10-CM | POA: Diagnosis not present

## 2014-10-02 DIAGNOSIS — Z7982 Long term (current) use of aspirin: Secondary | ICD-10-CM | POA: Diagnosis not present

## 2014-10-02 DIAGNOSIS — I2510 Atherosclerotic heart disease of native coronary artery without angina pectoris: Secondary | ICD-10-CM | POA: Diagnosis not present

## 2014-10-02 DIAGNOSIS — E119 Type 2 diabetes mellitus without complications: Secondary | ICD-10-CM | POA: Diagnosis not present

## 2014-10-02 DIAGNOSIS — Z7901 Long term (current) use of anticoagulants: Secondary | ICD-10-CM | POA: Diagnosis not present

## 2014-10-03 DIAGNOSIS — I482 Chronic atrial fibrillation: Secondary | ICD-10-CM | POA: Diagnosis not present

## 2014-10-03 DIAGNOSIS — I5033 Acute on chronic diastolic (congestive) heart failure: Secondary | ICD-10-CM | POA: Diagnosis not present

## 2014-10-03 DIAGNOSIS — I10 Essential (primary) hypertension: Secondary | ICD-10-CM | POA: Diagnosis not present

## 2014-10-03 DIAGNOSIS — Z8249 Family history of ischemic heart disease and other diseases of the circulatory system: Secondary | ICD-10-CM | POA: Diagnosis not present

## 2014-10-03 DIAGNOSIS — Z7982 Long term (current) use of aspirin: Secondary | ICD-10-CM | POA: Diagnosis not present

## 2014-10-03 DIAGNOSIS — E039 Hypothyroidism, unspecified: Secondary | ICD-10-CM | POA: Diagnosis not present

## 2014-10-03 DIAGNOSIS — E119 Type 2 diabetes mellitus without complications: Secondary | ICD-10-CM | POA: Diagnosis not present

## 2014-10-03 DIAGNOSIS — I2510 Atherosclerotic heart disease of native coronary artery without angina pectoris: Secondary | ICD-10-CM | POA: Diagnosis not present

## 2014-10-03 DIAGNOSIS — M109 Gout, unspecified: Secondary | ICD-10-CM | POA: Diagnosis not present

## 2014-10-03 DIAGNOSIS — Z800 Family history of malignant neoplasm of digestive organs: Secondary | ICD-10-CM | POA: Diagnosis not present

## 2014-10-03 DIAGNOSIS — I050 Rheumatic mitral stenosis: Secondary | ICD-10-CM | POA: Diagnosis not present

## 2014-10-03 DIAGNOSIS — Z880 Allergy status to penicillin: Secondary | ICD-10-CM | POA: Diagnosis not present

## 2014-10-03 DIAGNOSIS — Z79899 Other long term (current) drug therapy: Secondary | ICD-10-CM | POA: Diagnosis not present

## 2014-10-03 DIAGNOSIS — Z7901 Long term (current) use of anticoagulants: Secondary | ICD-10-CM | POA: Diagnosis not present

## 2014-10-03 DIAGNOSIS — Z87891 Personal history of nicotine dependence: Secondary | ICD-10-CM | POA: Diagnosis not present

## 2014-10-03 DIAGNOSIS — J189 Pneumonia, unspecified organism: Secondary | ICD-10-CM | POA: Diagnosis not present

## 2014-10-04 DIAGNOSIS — I2510 Atherosclerotic heart disease of native coronary artery without angina pectoris: Secondary | ICD-10-CM | POA: Diagnosis not present

## 2014-10-04 DIAGNOSIS — I482 Chronic atrial fibrillation: Secondary | ICD-10-CM | POA: Diagnosis not present

## 2014-10-04 DIAGNOSIS — I10 Essential (primary) hypertension: Secondary | ICD-10-CM | POA: Diagnosis not present

## 2014-10-04 DIAGNOSIS — I050 Rheumatic mitral stenosis: Secondary | ICD-10-CM | POA: Diagnosis not present

## 2014-10-04 DIAGNOSIS — Z79899 Other long term (current) drug therapy: Secondary | ICD-10-CM | POA: Diagnosis not present

## 2014-10-04 DIAGNOSIS — Z87891 Personal history of nicotine dependence: Secondary | ICD-10-CM | POA: Diagnosis not present

## 2014-10-04 DIAGNOSIS — I5033 Acute on chronic diastolic (congestive) heart failure: Secondary | ICD-10-CM | POA: Diagnosis not present

## 2014-10-04 DIAGNOSIS — E039 Hypothyroidism, unspecified: Secondary | ICD-10-CM | POA: Diagnosis not present

## 2014-10-04 DIAGNOSIS — Z800 Family history of malignant neoplasm of digestive organs: Secondary | ICD-10-CM | POA: Diagnosis not present

## 2014-10-04 DIAGNOSIS — E119 Type 2 diabetes mellitus without complications: Secondary | ICD-10-CM | POA: Diagnosis not present

## 2014-10-04 DIAGNOSIS — J189 Pneumonia, unspecified organism: Secondary | ICD-10-CM | POA: Diagnosis not present

## 2014-10-04 DIAGNOSIS — M109 Gout, unspecified: Secondary | ICD-10-CM | POA: Diagnosis not present

## 2014-10-04 DIAGNOSIS — Z880 Allergy status to penicillin: Secondary | ICD-10-CM | POA: Diagnosis not present

## 2014-10-04 DIAGNOSIS — Z8249 Family history of ischemic heart disease and other diseases of the circulatory system: Secondary | ICD-10-CM | POA: Diagnosis not present

## 2014-10-04 DIAGNOSIS — Z7901 Long term (current) use of anticoagulants: Secondary | ICD-10-CM | POA: Diagnosis not present

## 2014-10-04 DIAGNOSIS — Z7982 Long term (current) use of aspirin: Secondary | ICD-10-CM | POA: Diagnosis not present

## 2014-10-06 DIAGNOSIS — R7309 Other abnormal glucose: Secondary | ICD-10-CM | POA: Diagnosis not present

## 2014-10-06 DIAGNOSIS — R739 Hyperglycemia, unspecified: Secondary | ICD-10-CM | POA: Diagnosis not present

## 2014-10-07 ENCOUNTER — Other Ambulatory Visit: Payer: Self-pay | Admitting: *Deleted

## 2014-10-07 DIAGNOSIS — I471 Supraventricular tachycardia: Secondary | ICD-10-CM | POA: Diagnosis not present

## 2014-10-07 DIAGNOSIS — W010XXS Fall on same level from slipping, tripping and stumbling without subsequent striking against object, sequela: Secondary | ICD-10-CM | POA: Diagnosis not present

## 2014-10-07 DIAGNOSIS — E109 Type 1 diabetes mellitus without complications: Secondary | ICD-10-CM | POA: Diagnosis not present

## 2014-10-07 DIAGNOSIS — F339 Major depressive disorder, recurrent, unspecified: Secondary | ICD-10-CM | POA: Diagnosis not present

## 2014-10-07 DIAGNOSIS — D513 Other dietary vitamin B12 deficiency anemia: Secondary | ICD-10-CM | POA: Diagnosis not present

## 2014-10-07 DIAGNOSIS — E038 Other specified hypothyroidism: Secondary | ICD-10-CM | POA: Diagnosis not present

## 2014-10-07 DIAGNOSIS — I10 Essential (primary) hypertension: Secondary | ICD-10-CM | POA: Diagnosis not present

## 2014-10-07 DIAGNOSIS — I480 Paroxysmal atrial fibrillation: Secondary | ICD-10-CM | POA: Diagnosis not present

## 2014-10-07 MED ORDER — ALLOPURINOL 100 MG PO TABS: ORAL_TABLET | ORAL | Status: AC

## 2014-10-08 DIAGNOSIS — R29898 Other symptoms and signs involving the musculoskeletal system: Secondary | ICD-10-CM | POA: Diagnosis not present

## 2014-10-08 DIAGNOSIS — I440 Atrioventricular block, first degree: Secondary | ICD-10-CM | POA: Diagnosis not present

## 2014-10-08 DIAGNOSIS — I4891 Unspecified atrial fibrillation: Secondary | ICD-10-CM | POA: Diagnosis not present

## 2014-10-08 DIAGNOSIS — R072 Precordial pain: Secondary | ICD-10-CM | POA: Diagnosis not present

## 2014-10-08 DIAGNOSIS — Z7401 Bed confinement status: Secondary | ICD-10-CM | POA: Diagnosis not present

## 2014-10-08 DIAGNOSIS — M79602 Pain in left arm: Secondary | ICD-10-CM | POA: Diagnosis not present

## 2014-10-08 DIAGNOSIS — R109 Unspecified abdominal pain: Secondary | ICD-10-CM | POA: Diagnosis not present

## 2014-10-08 DIAGNOSIS — E038 Other specified hypothyroidism: Secondary | ICD-10-CM | POA: Diagnosis not present

## 2014-10-08 DIAGNOSIS — R000 Tachycardia, unspecified: Secondary | ICD-10-CM | POA: Diagnosis not present

## 2014-10-08 DIAGNOSIS — R42 Dizziness and giddiness: Secondary | ICD-10-CM | POA: Diagnosis not present

## 2014-10-08 DIAGNOSIS — I130 Hypertensive heart and chronic kidney disease with heart failure and stage 1 through stage 4 chronic kidney disease, or unspecified chronic kidney disease: Secondary | ICD-10-CM | POA: Diagnosis not present

## 2014-10-08 DIAGNOSIS — I342 Nonrheumatic mitral (valve) stenosis: Secondary | ICD-10-CM | POA: Diagnosis not present

## 2014-10-08 DIAGNOSIS — E1140 Type 2 diabetes mellitus with diabetic neuropathy, unspecified: Secondary | ICD-10-CM | POA: Diagnosis not present

## 2014-10-08 DIAGNOSIS — I509 Heart failure, unspecified: Secondary | ICD-10-CM | POA: Diagnosis not present

## 2014-10-08 DIAGNOSIS — Z8701 Personal history of pneumonia (recurrent): Secondary | ICD-10-CM | POA: Diagnosis not present

## 2014-10-08 DIAGNOSIS — R0602 Shortness of breath: Secondary | ICD-10-CM | POA: Diagnosis not present

## 2014-10-08 DIAGNOSIS — Z833 Family history of diabetes mellitus: Secondary | ICD-10-CM | POA: Diagnosis not present

## 2014-10-08 DIAGNOSIS — I10 Essential (primary) hypertension: Secondary | ICD-10-CM | POA: Diagnosis not present

## 2014-10-08 DIAGNOSIS — Z791 Long term (current) use of non-steroidal anti-inflammatories (NSAID): Secondary | ICD-10-CM | POA: Diagnosis not present

## 2014-10-08 DIAGNOSIS — E11649 Type 2 diabetes mellitus with hypoglycemia without coma: Secondary | ICD-10-CM | POA: Diagnosis not present

## 2014-10-08 DIAGNOSIS — D649 Anemia, unspecified: Secondary | ICD-10-CM | POA: Diagnosis not present

## 2014-10-08 DIAGNOSIS — Z7901 Long term (current) use of anticoagulants: Secondary | ICD-10-CM | POA: Diagnosis not present

## 2014-10-08 DIAGNOSIS — Z8249 Family history of ischemic heart disease and other diseases of the circulatory system: Secondary | ICD-10-CM | POA: Diagnosis not present

## 2014-10-08 DIAGNOSIS — I5032 Chronic diastolic (congestive) heart failure: Secondary | ICD-10-CM | POA: Diagnosis not present

## 2014-10-08 DIAGNOSIS — N179 Acute kidney failure, unspecified: Secondary | ICD-10-CM | POA: Diagnosis not present

## 2014-10-08 DIAGNOSIS — I5030 Unspecified diastolic (congestive) heart failure: Secondary | ICD-10-CM | POA: Diagnosis not present

## 2014-10-08 DIAGNOSIS — I480 Paroxysmal atrial fibrillation: Secondary | ICD-10-CM | POA: Diagnosis not present

## 2014-10-08 DIAGNOSIS — Z9889 Other specified postprocedural states: Secondary | ICD-10-CM | POA: Diagnosis not present

## 2014-10-08 DIAGNOSIS — I4892 Unspecified atrial flutter: Secondary | ICD-10-CM | POA: Diagnosis not present

## 2014-10-08 DIAGNOSIS — D509 Iron deficiency anemia, unspecified: Secondary | ICD-10-CM | POA: Diagnosis not present

## 2014-10-08 DIAGNOSIS — E1142 Type 2 diabetes mellitus with diabetic polyneuropathy: Secondary | ICD-10-CM | POA: Diagnosis not present

## 2014-10-08 DIAGNOSIS — Z87891 Personal history of nicotine dependence: Secondary | ICD-10-CM | POA: Diagnosis not present

## 2014-10-08 DIAGNOSIS — F419 Anxiety disorder, unspecified: Secondary | ICD-10-CM | POA: Diagnosis not present

## 2014-10-08 DIAGNOSIS — E162 Hypoglycemia, unspecified: Secondary | ICD-10-CM | POA: Diagnosis not present

## 2014-10-08 DIAGNOSIS — M1990 Unspecified osteoarthritis, unspecified site: Secondary | ICD-10-CM | POA: Diagnosis not present

## 2014-10-08 DIAGNOSIS — E538 Deficiency of other specified B group vitamins: Secondary | ICD-10-CM | POA: Diagnosis not present

## 2014-10-08 DIAGNOSIS — I481 Persistent atrial fibrillation: Secondary | ICD-10-CM | POA: Diagnosis not present

## 2014-10-08 DIAGNOSIS — Z79899 Other long term (current) drug therapy: Secondary | ICD-10-CM | POA: Diagnosis not present

## 2014-10-08 DIAGNOSIS — Z880 Allergy status to penicillin: Secondary | ICD-10-CM | POA: Diagnosis not present

## 2014-10-08 DIAGNOSIS — I493 Ventricular premature depolarization: Secondary | ICD-10-CM | POA: Diagnosis not present

## 2014-10-08 DIAGNOSIS — F329 Major depressive disorder, single episode, unspecified: Secondary | ICD-10-CM | POA: Diagnosis not present

## 2014-10-08 DIAGNOSIS — R0600 Dyspnea, unspecified: Secondary | ICD-10-CM | POA: Diagnosis not present

## 2014-10-08 DIAGNOSIS — E871 Hypo-osmolality and hyponatremia: Secondary | ICD-10-CM | POA: Diagnosis not present

## 2014-10-08 DIAGNOSIS — I050 Rheumatic mitral stenosis: Secondary | ICD-10-CM | POA: Diagnosis not present

## 2014-10-08 DIAGNOSIS — F411 Generalized anxiety disorder: Secondary | ICD-10-CM | POA: Diagnosis not present

## 2014-10-08 DIAGNOSIS — R531 Weakness: Secondary | ICD-10-CM | POA: Diagnosis not present

## 2014-10-08 DIAGNOSIS — N189 Chronic kidney disease, unspecified: Secondary | ICD-10-CM | POA: Diagnosis not present

## 2014-10-08 DIAGNOSIS — E039 Hypothyroidism, unspecified: Secondary | ICD-10-CM | POA: Diagnosis not present

## 2014-10-08 DIAGNOSIS — R079 Chest pain, unspecified: Secondary | ICD-10-CM | POA: Diagnosis not present

## 2014-10-08 DIAGNOSIS — M109 Gout, unspecified: Secondary | ICD-10-CM | POA: Diagnosis not present

## 2014-10-08 DIAGNOSIS — F332 Major depressive disorder, recurrent severe without psychotic features: Secondary | ICD-10-CM | POA: Diagnosis not present

## 2014-10-08 DIAGNOSIS — E119 Type 2 diabetes mellitus without complications: Secondary | ICD-10-CM | POA: Diagnosis not present

## 2014-10-08 DIAGNOSIS — M1009 Idiopathic gout, multiple sites: Secondary | ICD-10-CM | POA: Diagnosis not present

## 2014-10-15 ENCOUNTER — Ambulatory Visit: Payer: Medicare Other | Admitting: Family Medicine

## 2014-10-15 DIAGNOSIS — I440 Atrioventricular block, first degree: Secondary | ICD-10-CM | POA: Diagnosis not present

## 2014-10-16 DIAGNOSIS — E1140 Type 2 diabetes mellitus with diabetic neuropathy, unspecified: Secondary | ICD-10-CM | POA: Diagnosis not present

## 2014-10-16 DIAGNOSIS — R42 Dizziness and giddiness: Secondary | ICD-10-CM | POA: Diagnosis not present

## 2014-10-16 DIAGNOSIS — M1990 Unspecified osteoarthritis, unspecified site: Secondary | ICD-10-CM | POA: Diagnosis not present

## 2014-10-16 DIAGNOSIS — R072 Precordial pain: Secondary | ICD-10-CM | POA: Diagnosis not present

## 2014-10-16 DIAGNOSIS — F332 Major depressive disorder, recurrent severe without psychotic features: Secondary | ICD-10-CM | POA: Diagnosis not present

## 2014-10-16 DIAGNOSIS — F411 Generalized anxiety disorder: Secondary | ICD-10-CM | POA: Diagnosis not present

## 2014-10-16 DIAGNOSIS — E039 Hypothyroidism, unspecified: Secondary | ICD-10-CM | POA: Diagnosis not present

## 2014-10-16 DIAGNOSIS — D509 Iron deficiency anemia, unspecified: Secondary | ICD-10-CM | POA: Diagnosis not present

## 2014-10-16 DIAGNOSIS — E119 Type 2 diabetes mellitus without complications: Secondary | ICD-10-CM | POA: Diagnosis not present

## 2014-10-16 DIAGNOSIS — I509 Heart failure, unspecified: Secondary | ICD-10-CM | POA: Diagnosis not present

## 2014-10-16 DIAGNOSIS — I10 Essential (primary) hypertension: Secondary | ICD-10-CM | POA: Diagnosis not present

## 2014-10-16 DIAGNOSIS — E11649 Type 2 diabetes mellitus with hypoglycemia without coma: Secondary | ICD-10-CM | POA: Diagnosis not present

## 2014-10-16 DIAGNOSIS — I342 Nonrheumatic mitral (valve) stenosis: Secondary | ICD-10-CM | POA: Diagnosis not present

## 2014-10-16 DIAGNOSIS — E871 Hypo-osmolality and hyponatremia: Secondary | ICD-10-CM | POA: Diagnosis not present

## 2014-10-16 DIAGNOSIS — I482 Chronic atrial fibrillation: Secondary | ICD-10-CM | POA: Diagnosis not present

## 2014-10-16 DIAGNOSIS — E162 Hypoglycemia, unspecified: Secondary | ICD-10-CM | POA: Diagnosis not present

## 2014-10-16 DIAGNOSIS — I5030 Unspecified diastolic (congestive) heart failure: Secondary | ICD-10-CM | POA: Diagnosis not present

## 2014-10-16 DIAGNOSIS — Z7401 Bed confinement status: Secondary | ICD-10-CM | POA: Diagnosis not present

## 2014-10-16 DIAGNOSIS — M1009 Idiopathic gout, multiple sites: Secondary | ICD-10-CM | POA: Diagnosis not present

## 2014-10-16 DIAGNOSIS — I480 Paroxysmal atrial fibrillation: Secondary | ICD-10-CM | POA: Diagnosis not present

## 2014-10-17 DIAGNOSIS — E119 Type 2 diabetes mellitus without complications: Secondary | ICD-10-CM | POA: Diagnosis not present

## 2014-10-17 DIAGNOSIS — I482 Chronic atrial fibrillation: Secondary | ICD-10-CM | POA: Diagnosis not present

## 2014-10-17 DIAGNOSIS — I10 Essential (primary) hypertension: Secondary | ICD-10-CM | POA: Diagnosis not present

## 2014-10-17 DIAGNOSIS — I5030 Unspecified diastolic (congestive) heart failure: Secondary | ICD-10-CM | POA: Diagnosis not present

## 2014-10-25 DIAGNOSIS — Z8249 Family history of ischemic heart disease and other diseases of the circulatory system: Secondary | ICD-10-CM | POA: Diagnosis not present

## 2014-10-25 DIAGNOSIS — Z7982 Long term (current) use of aspirin: Secondary | ICD-10-CM | POA: Diagnosis not present

## 2014-10-25 DIAGNOSIS — D62 Acute posthemorrhagic anemia: Secondary | ICD-10-CM | POA: Diagnosis not present

## 2014-10-25 DIAGNOSIS — Z780 Asymptomatic menopausal state: Secondary | ICD-10-CM | POA: Diagnosis not present

## 2014-10-25 DIAGNOSIS — Z833 Family history of diabetes mellitus: Secondary | ICD-10-CM | POA: Diagnosis not present

## 2014-10-25 DIAGNOSIS — J9690 Respiratory failure, unspecified, unspecified whether with hypoxia or hypercapnia: Secondary | ICD-10-CM | POA: Diagnosis not present

## 2014-10-25 DIAGNOSIS — Z800 Family history of malignant neoplasm of digestive organs: Secondary | ICD-10-CM | POA: Diagnosis not present

## 2014-10-25 DIAGNOSIS — Z880 Allergy status to penicillin: Secondary | ICD-10-CM | POA: Diagnosis not present

## 2014-10-25 DIAGNOSIS — J9601 Acute respiratory failure with hypoxia: Secondary | ICD-10-CM | POA: Diagnosis not present

## 2014-10-25 DIAGNOSIS — Z7901 Long term (current) use of anticoagulants: Secondary | ICD-10-CM | POA: Diagnosis not present

## 2014-10-25 DIAGNOSIS — I5021 Acute systolic (congestive) heart failure: Secondary | ICD-10-CM | POA: Diagnosis not present

## 2014-10-25 DIAGNOSIS — J9622 Acute and chronic respiratory failure with hypercapnia: Secondary | ICD-10-CM | POA: Diagnosis not present

## 2014-10-25 DIAGNOSIS — I4891 Unspecified atrial fibrillation: Secondary | ICD-10-CM | POA: Diagnosis not present

## 2014-10-25 DIAGNOSIS — E039 Hypothyroidism, unspecified: Secondary | ICD-10-CM | POA: Diagnosis not present

## 2014-10-25 DIAGNOSIS — R918 Other nonspecific abnormal finding of lung field: Secondary | ICD-10-CM | POA: Diagnosis not present

## 2014-10-25 DIAGNOSIS — R069 Unspecified abnormalities of breathing: Secondary | ICD-10-CM | POA: Diagnosis not present

## 2014-10-25 DIAGNOSIS — J189 Pneumonia, unspecified organism: Secondary | ICD-10-CM | POA: Diagnosis not present

## 2014-10-25 DIAGNOSIS — E119 Type 2 diabetes mellitus without complications: Secondary | ICD-10-CM | POA: Diagnosis not present

## 2014-10-25 DIAGNOSIS — R0602 Shortness of breath: Secondary | ICD-10-CM | POA: Diagnosis not present

## 2014-10-25 DIAGNOSIS — K922 Gastrointestinal hemorrhage, unspecified: Secondary | ICD-10-CM | POA: Diagnosis not present

## 2014-10-25 DIAGNOSIS — J9602 Acute respiratory failure with hypercapnia: Secondary | ICD-10-CM | POA: Diagnosis not present

## 2014-10-25 DIAGNOSIS — Z79899 Other long term (current) drug therapy: Secondary | ICD-10-CM | POA: Diagnosis not present

## 2014-10-25 DIAGNOSIS — Z888 Allergy status to other drugs, medicaments and biological substances status: Secondary | ICD-10-CM | POA: Diagnosis not present

## 2014-10-25 DIAGNOSIS — M109 Gout, unspecified: Secondary | ICD-10-CM | POA: Diagnosis not present

## 2014-10-25 DIAGNOSIS — E875 Hyperkalemia: Secondary | ICD-10-CM | POA: Diagnosis not present

## 2014-10-25 DIAGNOSIS — N183 Chronic kidney disease, stage 3 (moderate): Secondary | ICD-10-CM | POA: Diagnosis not present

## 2014-10-25 DIAGNOSIS — I509 Heart failure, unspecified: Secondary | ICD-10-CM | POA: Diagnosis not present

## 2014-10-25 DIAGNOSIS — I214 Non-ST elevation (NSTEMI) myocardial infarction: Secondary | ICD-10-CM | POA: Diagnosis not present

## 2014-10-25 DIAGNOSIS — Z808 Family history of malignant neoplasm of other organs or systems: Secondary | ICD-10-CM | POA: Diagnosis not present

## 2014-11-28 DEATH — deceased

## 2015-07-17 IMAGING — US US RENAL
1 series · 14 of 25 positions shown · non-contrast
Comparison: None.

CLINICAL DATA: Accelerated hypertension.

EXAM:
RENAL/URINARY TRACT ULTRASOUND COMPLETE

[Series 1: us renal · 0.16mm/px · 14 of 27 slices shown]
[im 1/27]
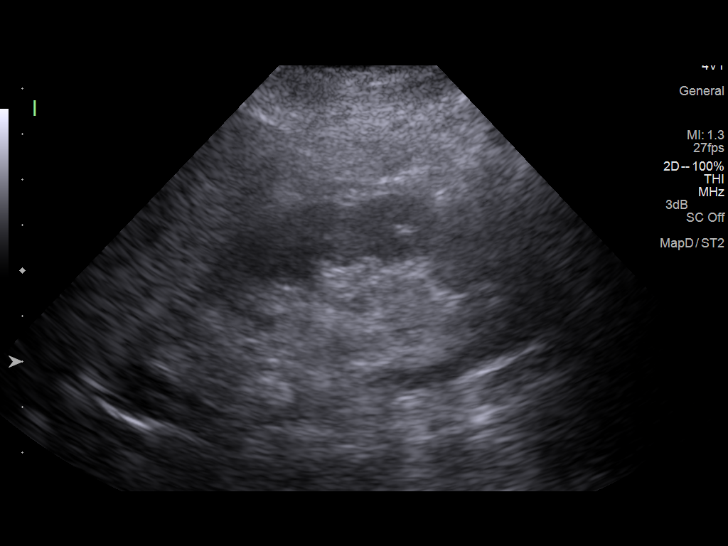
[im 3/27]
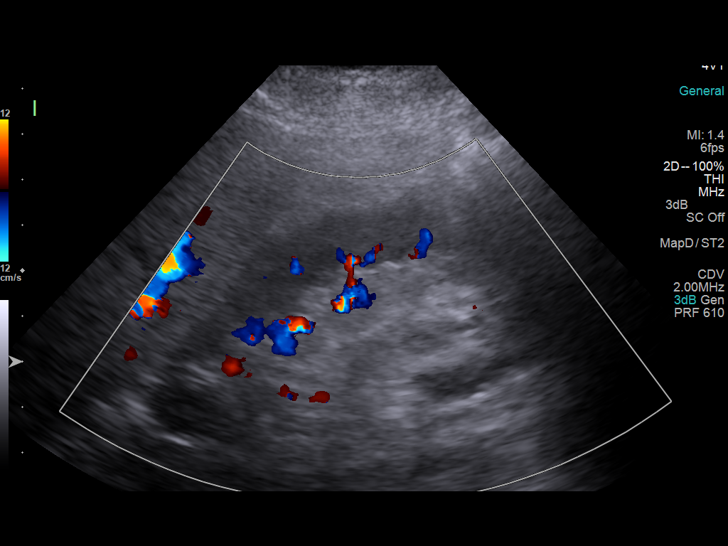
[im 5/27]
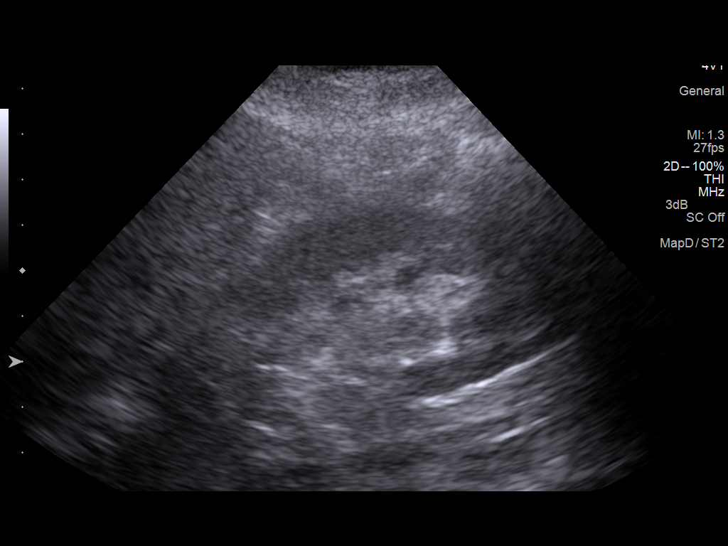
[im 7/27]
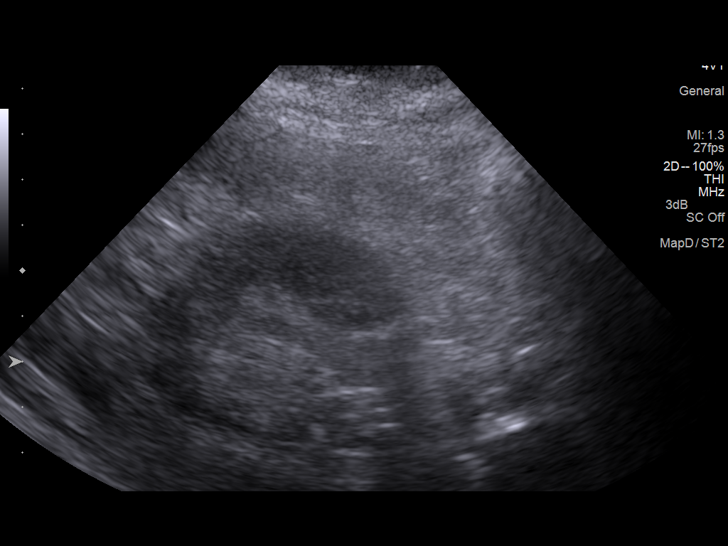
[im 9/27]
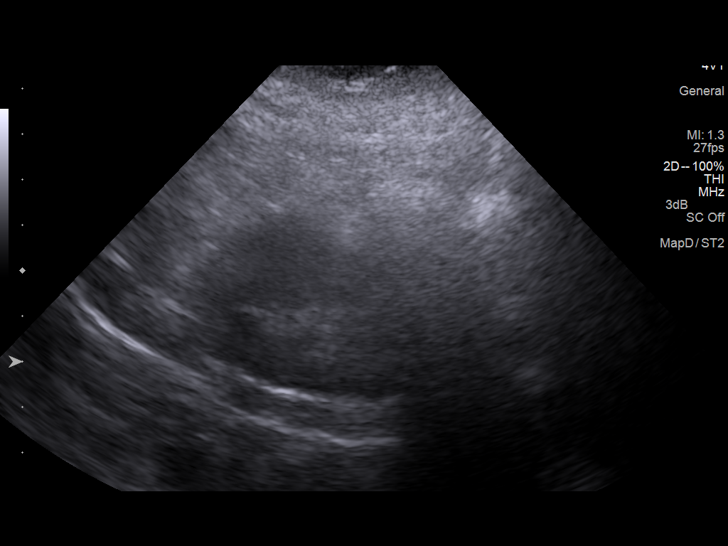
[im 10/27]
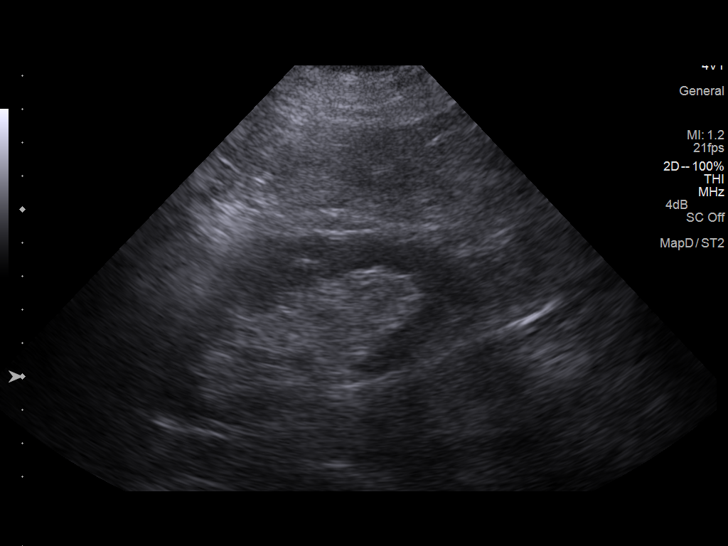
[im 12/27]
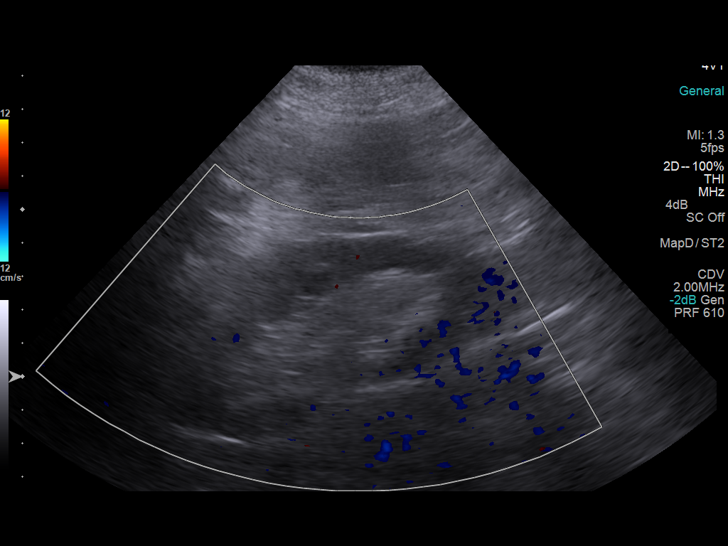
[im 15/27]
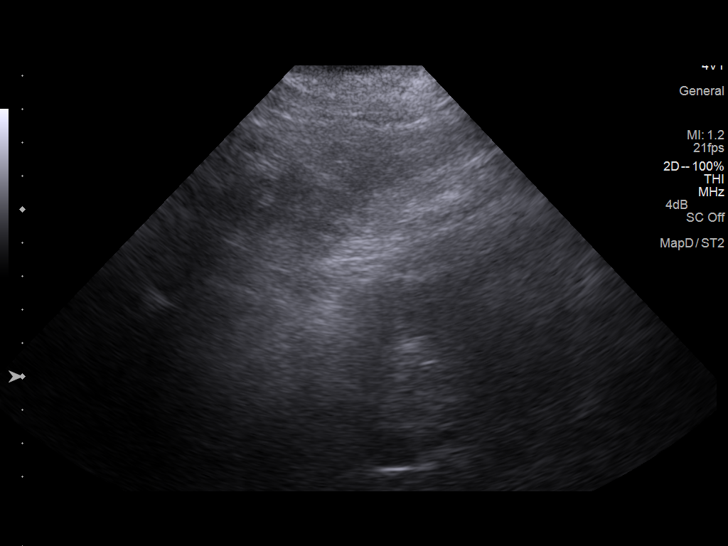
[im 17/27]
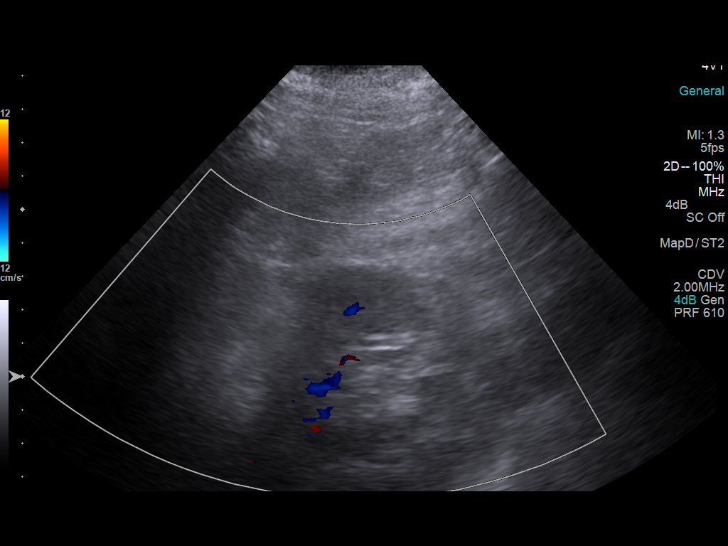
[im 18/27]
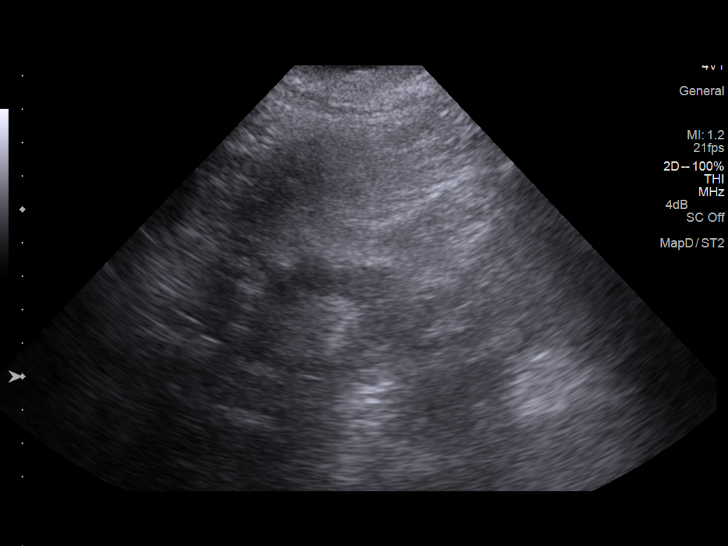
[im 20/27]
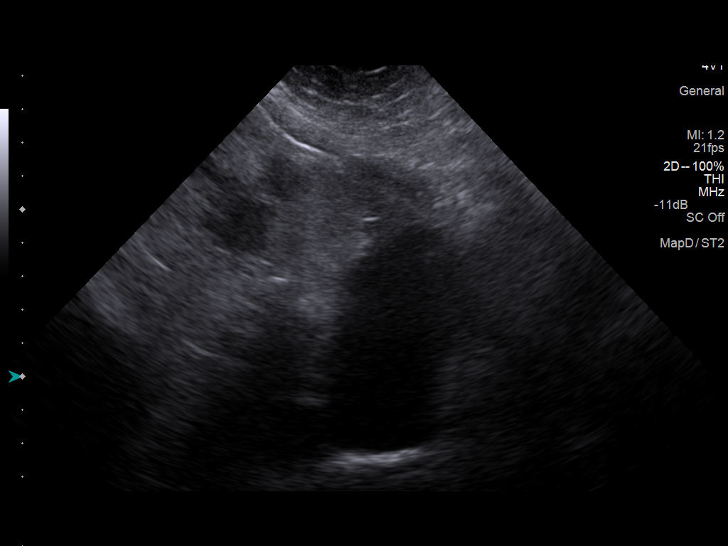
[im 22/27]
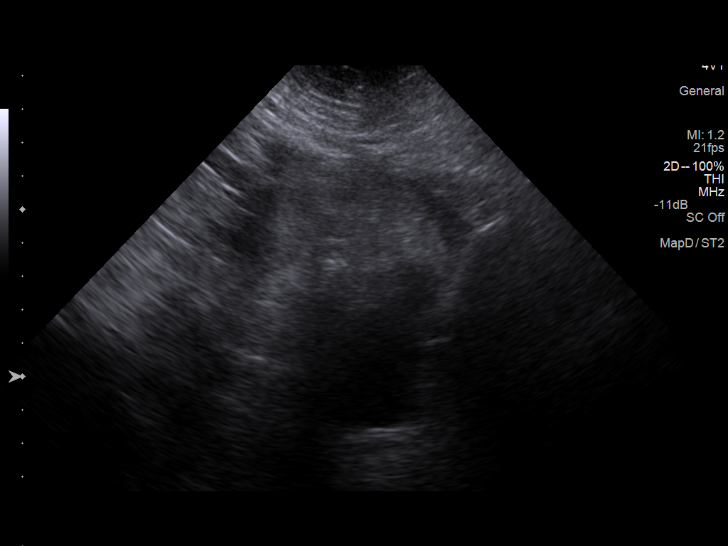
[im 24/27]
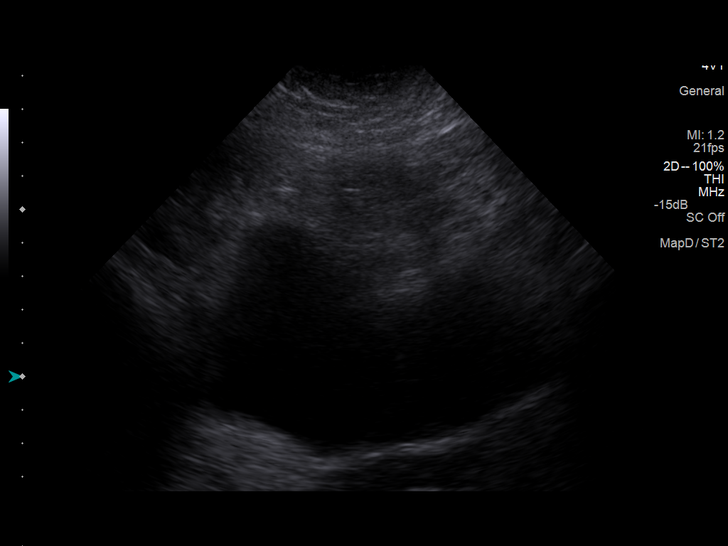
[im 27/27]
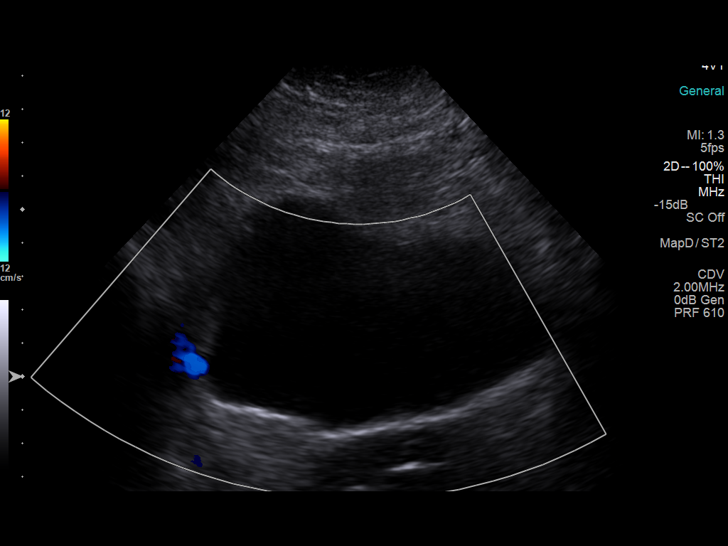

[14 of 25 positions shown; findings below may reference images not displayed]

FINDINGS: Right Kidney

Length: 10.3 cm. Echogenicity within normal limits. No mass or
hydronephrosis visualized.

Left Kidney

Length: 10.4 cm. Echogenicity within normal limits. No mass or
hydronephrosis visualized.

Bladder

Appears normal for degree of bladder distention. Ureteral jets were
not identified.
IMPRESSION: Normal appearing kidneys. No size discrepancy to suggest
renovascular insufficiency.

## 2017-01-12 IMAGING — DX DG CHEST 2V
2 series · 2 of 2 positions shown · non-contrast
Comparison: Prior chest x-ray 12/15/2012

CLINICAL DATA: 80-year-old female with shortness of breath and
orthopnea

EXAM:
CHEST  2 VIEW

[chest pa]
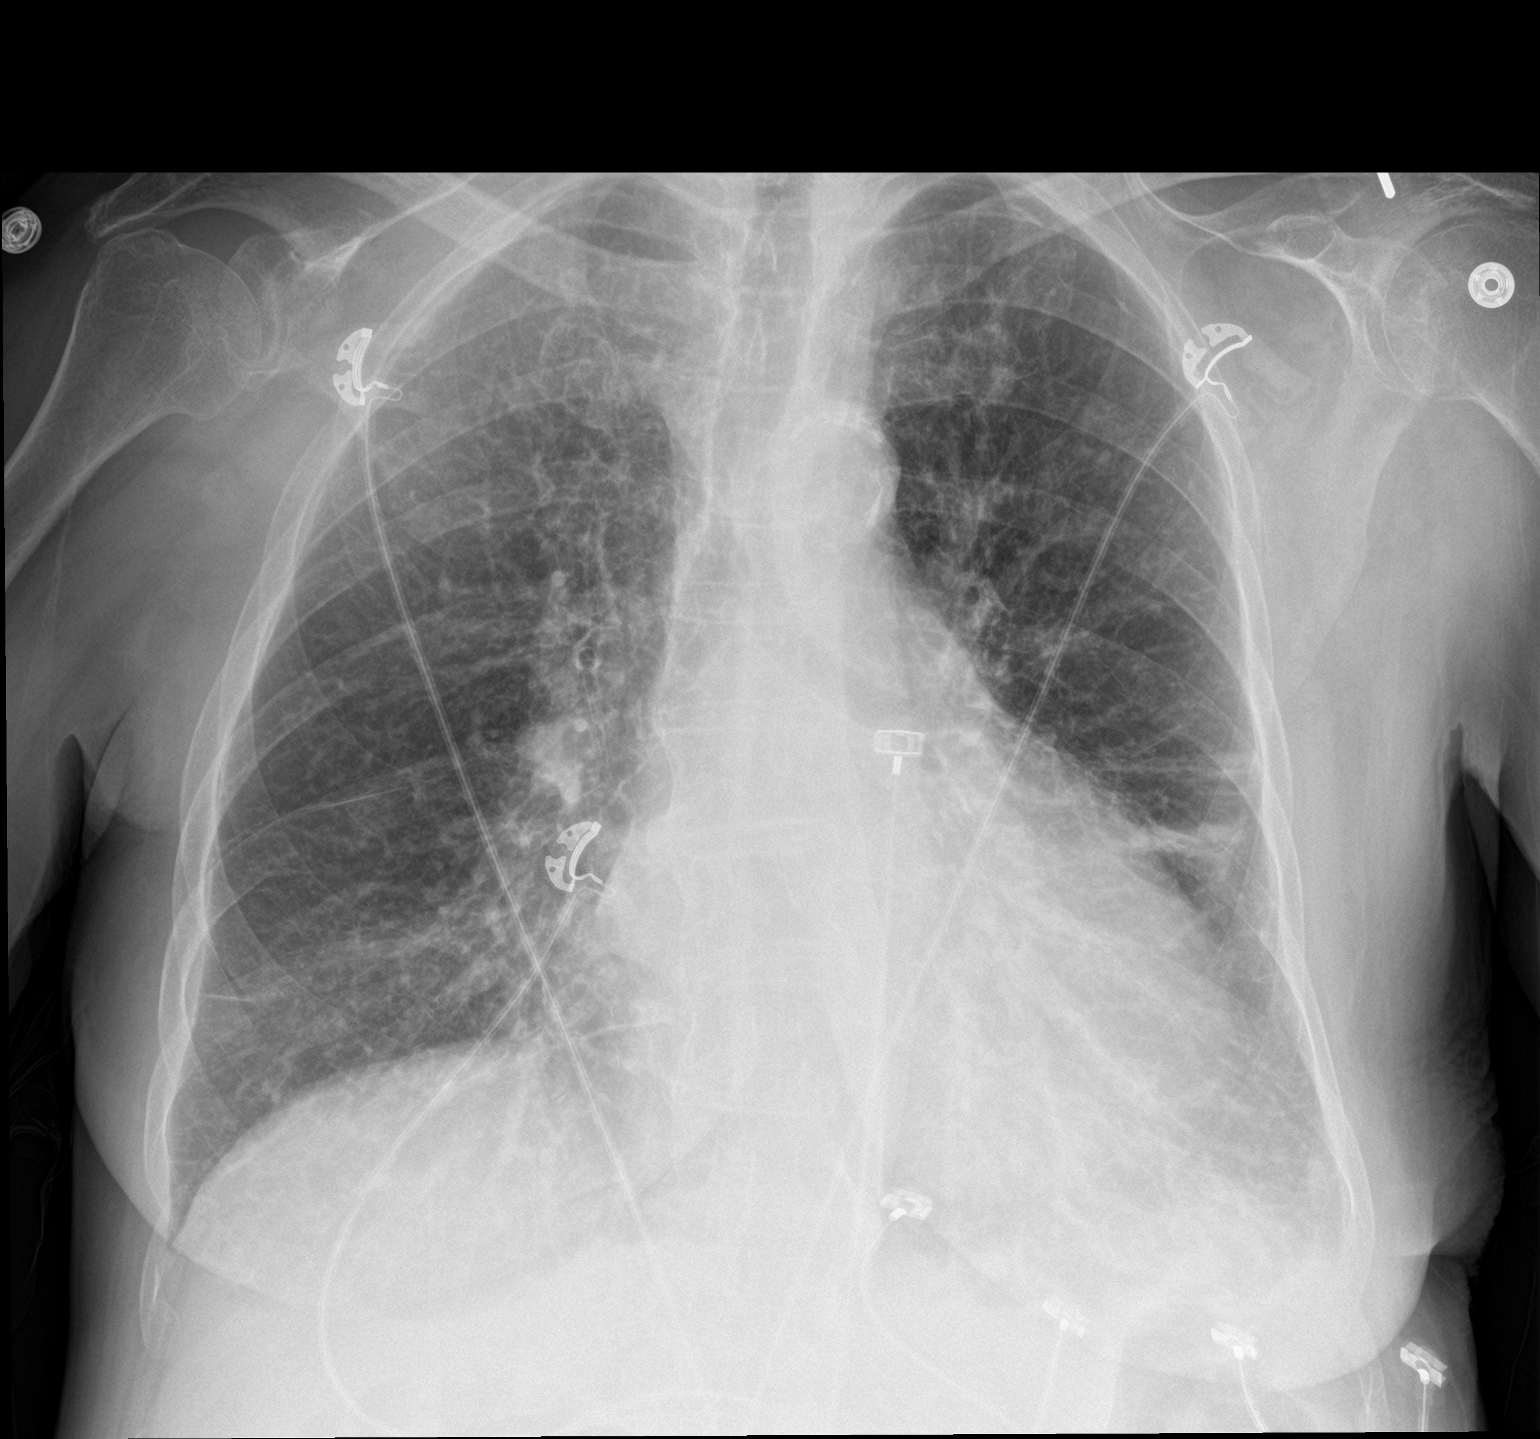

[chest lat]
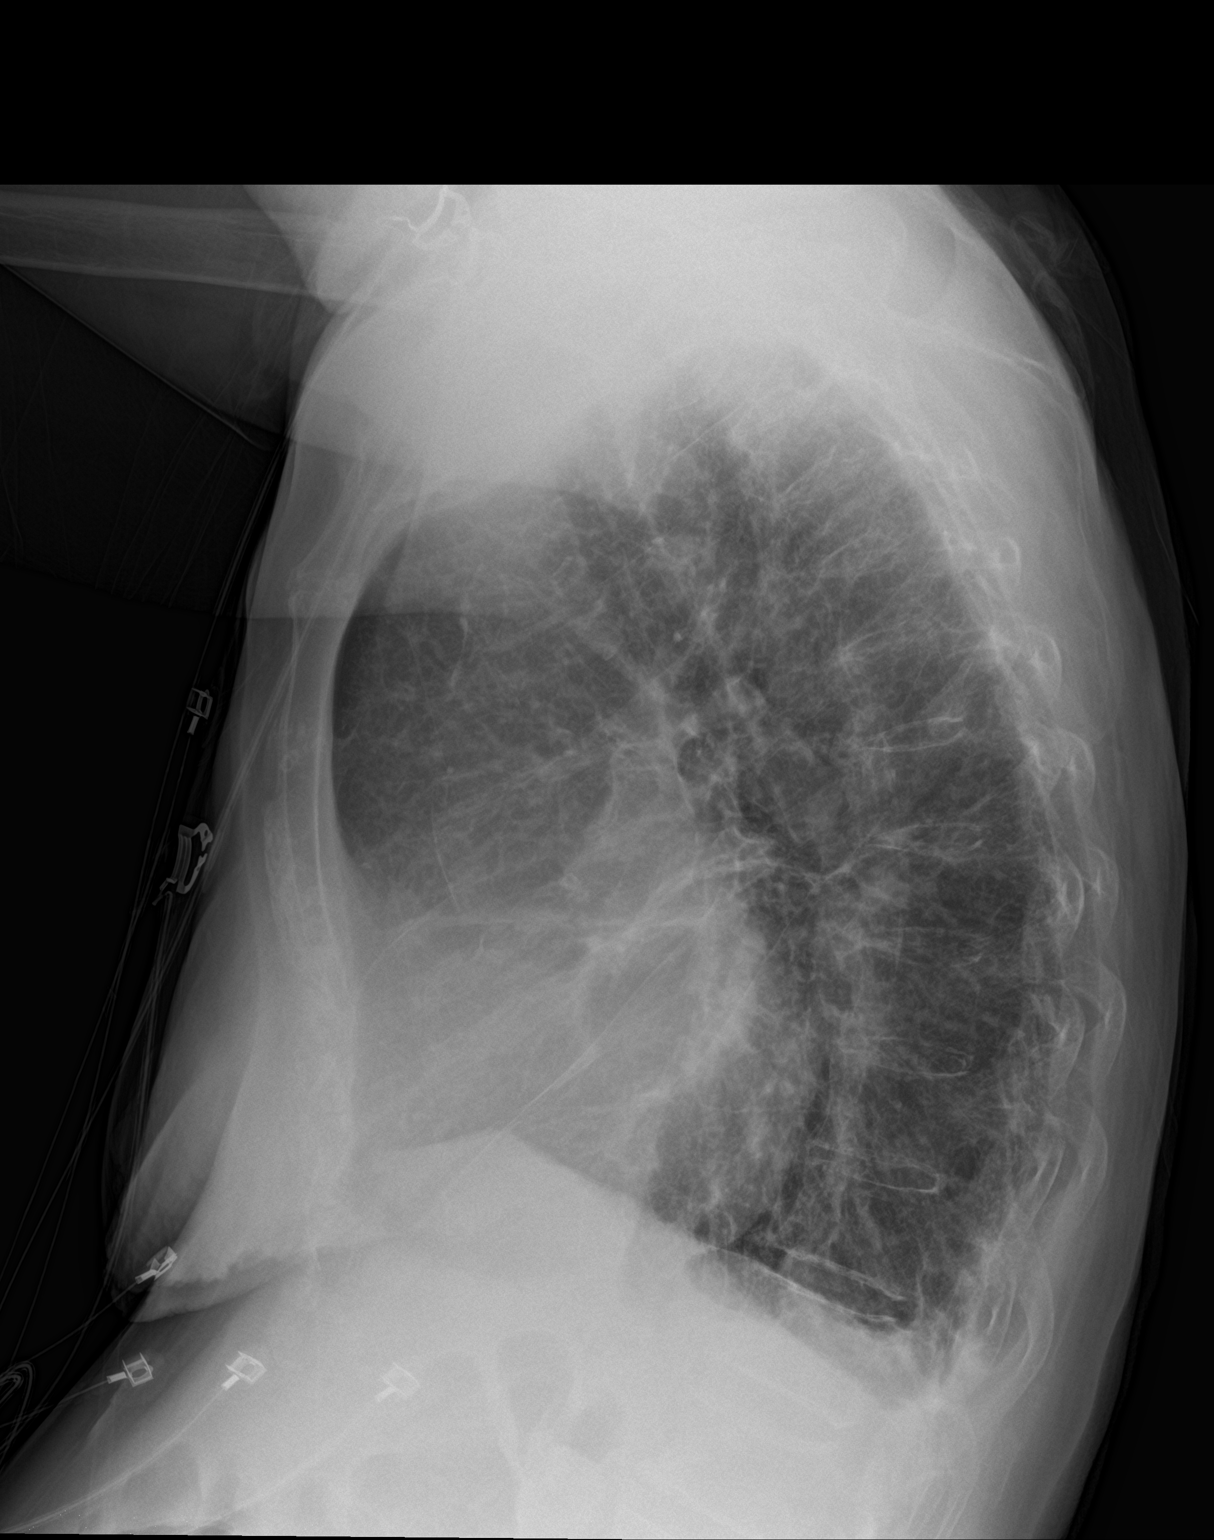

[2 of 2 positions shown; findings below may reference images not displayed]

FINDINGS: Stable cardiomegaly. Atherosclerotic calcification present in the
transverse aorta. Progressed pulmonary vascular congestion now with
bilateral Kerley B-lines consistent with mild interstitial edema.
Slightly increased bibasilar atelectasis with probable trace pleural
effusions. Edema is superimposed on a background hyperinflation and
chronic bronchitic change. The bones are osteopenic. No acute
osseous abnormality.
IMPRESSION: 1. Mild CHF.
2. Probable trace bilateral pleural effusions.
3. Stable cardiomegaly.
